# Patient Record
Sex: Female | Born: 1995 | Race: Black or African American | Hispanic: No | Marital: Single | Smoking: Never smoker
Health system: Southern US, Community
[De-identification: ages and names within clinical notes are randomized; demographics above are authoritative.]

## PROBLEM LIST (undated history)

## (undated) DIAGNOSIS — J45909 Unspecified asthma, uncomplicated: Secondary | ICD-10-CM

## (undated) DIAGNOSIS — L0231 Cutaneous abscess of buttock: Principal | ICD-10-CM

## (undated) DIAGNOSIS — L309 Dermatitis, unspecified: Secondary | ICD-10-CM

## (undated) HISTORY — DX: Unspecified asthma, uncomplicated: J45.909

## (undated) HISTORY — PX: NO PAST SURGERIES: SHX2092

## (undated) HISTORY — DX: Dermatitis, unspecified: L30.9

## (undated) HISTORY — DX: Cutaneous abscess of buttock: L02.31

## (undated) HISTORY — PX: WISDOM TOOTH EXTRACTION: SHX21

---
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
## 2013-02-27 ENCOUNTER — Telehealth: Payer: Self-pay | Admitting: Family Medicine

## 2013-02-27 DIAGNOSIS — Z9109 Other allergy status, other than to drugs and biological substances: Secondary | ICD-10-CM

## 2013-02-27 MED ORDER — CETIRIZINE HCL 10 MG PO TABS: 10.0000 mg | ORAL_TABLET | Freq: Every day | ORAL | Status: DC

## 2013-02-27 NOTE — Telephone Encounter (Signed)
Fwd. To PCP for refill. .Jill Reynolds  

## 2013-02-27 NOTE — Telephone Encounter (Signed)
Patient is calling for a refill on her Zyrtec to go to [facility] on [address].  Her pharmacy asked her to call because she doesn't have anymore refills.

## 2013-02-27 NOTE — Telephone Encounter (Signed)
Completed.

## 2013-04-01 ENCOUNTER — Ambulatory Visit (INDEPENDENT_AMBULATORY_CARE_PROVIDER_SITE_OTHER): Payer: Medicaid Other | Admitting: Family Medicine

## 2013-04-01 ENCOUNTER — Encounter: Payer: Self-pay | Admitting: Family Medicine

## 2013-04-01 VITALS — BP 126/79 | HR 76 | Temp 98.4°F | Ht 62.5 in | Wt 170.2 lb

## 2013-04-01 DIAGNOSIS — S31809A Unspecified open wound of unspecified buttock, initial encounter: Secondary | ICD-10-CM

## 2013-04-01 DIAGNOSIS — K6289 Other specified diseases of anus and rectum: Secondary | ICD-10-CM | POA: Insufficient documentation

## 2013-04-01 MED ORDER — BACITRA-NEOMYCIN-POLYMYXIN-HC 1 % OP OINT: TOPICAL_OINTMENT | OPHTHALMIC | Status: DC

## 2013-04-01 MED ORDER — BACIT-POLY-NEO HC 1 % EX OINT: TOPICAL_OINTMENT | Freq: Two times a day (BID) | CUTANEOUS | Status: DC

## 2013-04-01 MED ORDER — DIBUCAINE 1 % RE OINT: 1.0000 "application " | TOPICAL_OINTMENT | RECTAL | Status: DC | PRN

## 2013-04-01 NOTE — Assessment & Plan Note (Signed)
  Intergluteal cleft cracks: Small linear break in skin.     Cortisporin ointment prescribed.     Gluteal hygiene recommended.     Keep Gluteal cleft clean and dry.

## 2013-04-01 NOTE — Patient Instructions (Addendum)
Sitz Bath A sitz bath is a warm water bath taken in the sitting position. The water covers only the hips and butt (buttocks). It may be used for either healing or cleaning purposes. Sitz baths are also used to relieve pain, itching, or muscle tightening (spasms). The water may contain medicine. Moist heat will help you heal and relax.  HOME CARE   Fill the bathtub half-full with warm water.  Sit in the water and open the drain a little.  Turn on the warm water to keep the tub half-full. Keep the water running constantly.  Soak in the water for 15 to 20 minutes.  After the sitz bath, pat the affected area dry.  Take 3 to 4 sitz baths a day. GET HELP RIGHT AWAY IF: You get worse instead of better. Stop the sitz baths if you get worse. MAKE SURE YOU:  Understand these instructions.  Will watch your condition.  Will get help right away if you are not doing well or get worse. Document Released: 12/07/2004 Document Revised: 01/22/2012 Document Reviewed: 02/27/2011 [facility] Patient Information 2013 [place], [place].

## 2013-04-01 NOTE — Assessment & Plan Note (Signed)
Anal pain: Although anoscopy was normal,she might have mild anal fissure.  I recommended sitz bath.     I refilled her  Dibucaine 1% prn pain.     If no improvement or symptom persist might need scoping. F/U in 2-4 wks prn.

## 2013-04-01 NOTE — Progress Notes (Signed)
Subjective:     Patient ID: Jill Reynolds, female   DOB: 1996/04/14, 17 y.o.   MRN: [id number]  HPI Anal pain/Intergluteal cleft itching: This has been going on for the last few month,this resolved with Dibucaine but restarted over the weekend since she stopped using it. There is associated itching as well,pain is annoying about  1-2/10 in severity.No diarrhea,no constipation,no diarrhea,no blood in her stool,no stomach pain. She was told in the past she has hemorrhoids. She also has itching and pain between her gluteal cleft that crack open at times.  Current Outpatient Prescriptions on File Prior to Visit  Medication Sig Dispense Refill  . cetirizine (ZYRTEC) 10 MG tablet Take 1 tablet (10 mg total) by mouth daily.  30 tablet  11  . fluticasone (FLONASE) 50 MCG/ACT nasal spray Place 2 sprays into the nose daily.  16 g  6  . witch hazel-glycerin (TUCKS) pad Apply 1 application topically as needed.  40 each    . albuterol (VENTOLIN HFA) 108 (90 BASE) MCG/ACT inhaler Inhale 2 puffs into the lungs every 4 (four) hours as needed.  1 Inhaler  1  . beclomethasone (QVAR) 80 MCG/ACT inhaler Inhale 1 puff into the lungs 2 (two) times daily.  1 Inhaler  12  . benzocaine-Menthol (DERMOPLAST) 20-0.5 % AERO Apply 1 application topically as needed (perineal discomfort).      . EPINEPHrine (EPIPEN 2-PAK) 0.3 mg/0.3 mL DEVI Inject 0.3 mg into the muscle once.      . lanolin OINT Apply 1 application topically as needed (for breast care).       No current facility-administered medications on file prior to visit.   Past Medical History  Diagnosis Date  . No pertinent past medical history     Review of Systems  Respiratory: Negative.   Cardiovascular: Negative.   Gastrointestinal: Negative.  Negative for nausea, vomiting, abdominal pain, blood in stool and anal bleeding.       Anal itching and pain.  Genitourinary: Negative.   All other systems reviewed and are negative.   Filed Vitals:   04/01/13 0956   BP: 126/79  Pulse: 76  Temp: 98.4 F (36.9 C)  TempSrc: Oral  Height: 5' 2.5" (1.588 m)  Weight: 170 lb 3.2 oz (77.202 kg)        Objective:   Physical Exam  Nursing note and vitals reviewed. Constitutional: She appears well-developed.  Cardiovascular: Normal rate, regular rhythm, normal heart sounds and intact distal pulses.   No murmur heard. Pulmonary/Chest: Effort normal and breath sounds normal. No respiratory distress. She has no wheezes.  Abdominal: Soft. Bowel sounds are normal. She exhibits no distension and no mass. There is no tenderness.  Genitourinary: Rectum normal. Rectal exam shows no internal hemorrhoid and no fissure. Guaiac negative stool.  Could not see any external hemorrhoid partly because of large skin fold around her gluteal area. She does have cracking of the skin of her intergluteal cleft with linear skin break. Anoscope normal findings.       Assessment:     Anal pain: Although anoscopy was normal,she might have mild anal fissure. Intergluteal cleft cracks: Small linear break in skin.    Plan:     1. I recommended sitz bath.     I refilled her  Dibucaine 1% prn pain.     If no improvement or symptom persist might need scoping. F/U in 2-4 wks prn.  2. Cortisporin ointment prescribed.     Gluteal hygiene recommended.  Keep Gluteal cleft clean and dry.     More than 30 min spent for face to face encounter and coordination of care.

## 2013-05-19 ENCOUNTER — Encounter: Payer: Self-pay | Admitting: Family Medicine

## 2013-05-19 ENCOUNTER — Ambulatory Visit (INDEPENDENT_AMBULATORY_CARE_PROVIDER_SITE_OTHER): Payer: Medicaid Other | Admitting: Family Medicine

## 2013-05-19 VITALS — BP 138/75 | HR 98 | Temp 98.1°F | Wt 166.2 lb

## 2013-05-19 DIAGNOSIS — Z7251 High risk heterosexual behavior: Secondary | ICD-10-CM

## 2013-05-19 DIAGNOSIS — L0291 Cutaneous abscess, unspecified: Secondary | ICD-10-CM

## 2013-05-19 DIAGNOSIS — L03317 Cellulitis of buttock: Secondary | ICD-10-CM

## 2013-05-19 DIAGNOSIS — L0231 Cutaneous abscess of buttock: Secondary | ICD-10-CM

## 2013-05-19 MED ORDER — CLINDAMYCIN HCL 300 MG PO CAPS: 300.0000 mg | ORAL_CAPSULE | Freq: Three times a day (TID) | ORAL | Status: DC

## 2013-05-19 NOTE — Progress Notes (Signed)
  Subjective:    Patient ID: Jill Reynolds, female    DOB: [birth year], 17 y.o.   MRN: [id number]  HPI  17 year old F with insect bites. Occurred 2-3 weeks ago while visiting her grandmother. She notied that waking up in the morning after sleeping in a particular bed, she had multiple itching bites. She changed beds and this did not recur. However, since that time, she notes that her bites has become "boils." One on her abdomen that "busted" and two more near her buttocks. Symptoms include pain from these areas which prevents her from sitting down comfortably. She denies fever, chills, nausea or vomiting. She tried to treat them with tea tree oil, but this was ineffective.   Review of Systems See HPI    Objective:   Physical Exam  Constitutional: Vital signs are normal. She appears well-developed and well-nourished. She does not appear ill.  Patient in visible discomfort while trying to sit  Skin: Skin is warm and dry. Rash noted.      BP 138/75  Pulse 98  Temp(Src) 98.1 F (36.7 C) (Oral)  Wt 166 lb 3.2 oz (75.388 kg)  BMI 29.9 kg/m2        Assessment & Plan:

## 2013-05-19 NOTE — Patient Instructions (Addendum)
Miss Tuft,   I think that you have skin abscesses resulting from the bug bites. Do not scratch these areas. Rinse them very well with warm water each day. Also, start taking a medication called Clindamycin three time a day for 10 days. Take the entire amount even if you feel better.   Please follow up next week and we may be able to do the other tests that you wanted.   Sincerely,   Dr. Clinton Sawyer

## 2013-05-21 DIAGNOSIS — L0231 Cutaneous abscess of buttock: Secondary | ICD-10-CM | POA: Insufficient documentation

## 2013-05-21 HISTORY — DX: Cutaneous abscess of buttock: L02.31

## 2013-05-21 NOTE — Assessment & Plan Note (Addendum)
It is unclear if this is truly related to bug bites or whether previous gluteal injuries became infection, but clearly, the patient has draining abscesses on her buttocks. The right buttock was worse than the left. The right side was cultured, then cleaned with sterile water and packed with sterile cause. The left was cleaned with sterile water and a gauze bandage was place over it. I think she has a high risk of have MRSA infections, and bactroban may not work be effective given high friction area, so I will treat with clindamycin. F/u culture and have patient return in 1 week for follow up. Instructed to return sooner if developing worsening pain or fever.

## 2013-05-22 LAB — WOUND CULTURE

## 2013-05-28 ENCOUNTER — Encounter: Payer: Self-pay | Admitting: Family Medicine

## 2013-05-28 ENCOUNTER — Ambulatory Visit (INDEPENDENT_AMBULATORY_CARE_PROVIDER_SITE_OTHER): Payer: Medicaid Other | Admitting: Family Medicine

## 2013-05-28 VITALS — BP 137/78 | HR 78 | Temp 98.3°F | Ht 62.5 in | Wt 164.0 lb

## 2013-05-28 DIAGNOSIS — L0231 Cutaneous abscess of buttock: Secondary | ICD-10-CM

## 2013-05-28 DIAGNOSIS — L03317 Cellulitis of buttock: Secondary | ICD-10-CM

## 2013-05-28 NOTE — Patient Instructions (Signed)
Dear Miss Northington,   I am glad that your wounds are healing. You should continue the rest of the antibiotics and have problems afterwards. There is no risk to other family members at this time.   Please follow up if needed for worsening pain, drainage or development of a fever.   Sincerely,   Dr. Clinton Sawyer

## 2013-05-28 NOTE — Progress Notes (Signed)
  Subjective:    Patient ID: Jill Reynolds, female    DOB: 1996-02-09, 17 y.o.   MRN: [id number]  HPI  17 year old F who presents for follow up of multiple MRSA abscesses on her buttocks. Culture showed sensitivity to Clindamycin, which she was started on last week. Currently, she notes that her pain and drainage if improving. She is taking the Clindamycin as prescribed and has not missed any doses. She denies fever, chills, nausea or vomiting.   Review of Systems See HPI    Objective:   Physical Exam BP 137/78  Pulse 78  Temp(Src) 98.3 F (36.8 C) (Oral)  Ht 5' 2.5" (1.588 m)  Wt 164 lb (74.39 kg)  BMI 29.5 kg/m2  Gen: well appearing, non distressed Skin: left gluteal abscess healing without drainage, no surrounding erythema or fluctuance, base with good granulation tissue; right gluteal abscess well healed     Assessment & Plan:

## 2013-05-28 NOTE — Assessment & Plan Note (Signed)
Healing nicely on Clindamycin. No follow up needed.

## 2013-07-07 ENCOUNTER — Encounter: Payer: Self-pay | Admitting: Family Medicine

## 2013-07-07 ENCOUNTER — Ambulatory Visit (INDEPENDENT_AMBULATORY_CARE_PROVIDER_SITE_OTHER): Payer: Medicaid Other | Admitting: Family Medicine

## 2013-07-07 VITALS — BP 132/86 | HR 82 | Temp 99.0°F | Ht 62.0 in | Wt 162.0 lb

## 2013-07-07 DIAGNOSIS — L0231 Cutaneous abscess of buttock: Secondary | ICD-10-CM

## 2013-07-07 DIAGNOSIS — T782XXA Anaphylactic shock, unspecified, initial encounter: Secondary | ICD-10-CM

## 2013-07-07 MED ORDER — CHLORHEXIDINE GLUCONATE 4 % EX LIQD: 60.0000 mL | Freq: Every day | CUTANEOUS | Status: DC | PRN

## 2013-07-07 MED ORDER — EPINEPHRINE 0.3 MG/0.3ML IJ SOAJ: 0.3000 mg | Freq: Once | INTRAMUSCULAR | Status: DC

## 2013-07-07 NOTE — Patient Instructions (Addendum)
Community-Associated MRSA CA-MRSA stands for community-associated methicillin-resistant Staphylococcus aureus. MRSA is a type of bacteria that is resistant to some common antibiotics. It can cause infections in the skin and many other places in the body. Staphylococcus aureus, often called "staph," is a bacteria that normally lives on the skin or in the nose. Staph on the surface of the skin or in the nose does not cause problems. However, if the staph enters the body through a cut, wound, or break in the skin, an infection can happen. Up until recently, infections with the MRSA type of staph mainly occurred in hospitals and other healthcare settings. There are now increasing problems with MRSA infections in the community as well. Infections with MRSA may be very serious or even life-threatening. CA-MRSA is becoming more common. It is known to spread in crowded settings, in jails and prisons, and in situations where there is close skin-to-skin contact, such as during sporting events or in locker rooms. MRSA can be spread through shared items, such as children's toys, razors, towels, or sports equipment.  CAUSES All staph, including MRSA, are normally harmless unless they enter the body through a scratch, cut, or wound, such as with surgery. All staph, including MRSA, can be spread from person-to-person by touching contaminated objects or through direct contact.  MRSA now causes illness in people who have not been in hospitals or other healthcare facilities. Cases of MRSA diseases in the community have been associated with:  Recent antibiotic use.  Sharing contaminated towels or clothes.  Having active skin diseases.  Participating in contact sports.  Living in crowded settings.  Intravenous (IV) drug use.  Community-associated MRSA infections are usually skin infections, but may cause other severe illnesses.  Staph bacteria are one of the most common causes of skin infection. However, they are  also a common cause of pneumonia, bone or joint infections, and bloodstream infections. DIAGNOSIS Diagnosis of MRSA is done by cultures of fluid samples that may come from:  Swabs taken from cuts or wounds in infected areas.  Nasal swabs.  Saliva or deep cough specimens from the lungs (sputum).  Urine.  Blood. Many people are "colonized" with MRSA but have no signs of infection. This means that people carry the MRSA germ on their skin or in their nose and may never develop MRSA infection.  TREATMENT  Treatment varies and is based on how serious, how deep, or how extensive the infection is. For example:  Some skin infections, such as a small boil or abscess, may be treated by draining yellowish-white fluid (pus) from the site of the infection.  Deeper or more widespread soft tissue infections are usually treated with surgery to drain pus and with antibiotic medicine given by vein or by mouth. This may be recommended even if you are pregnant.  Serious infections may require a hospital stay. If antibiotics are given, they may be needed for several weeks. PREVENTION Because many people are colonized with staph, including MRSA, preventing the spread of the bacteria from person-to-person is most important. The best way to prevent the spread of bacteria and other germs is through proper hand washing or by using alcohol-based hand disinfectants. The following are other ways to help prevent MRSA infection within community settings.   Wash your hands frequently with soap and water for at least 15 seconds. Otherwise, use alcohol-based hand disinfectants when soap and water is not available.  Make sure people who live with you wash their hands often, too.  Do not share  personal items. For example, avoid sharing razors and other personal hygiene items, towels, clothing, and athletic equipment.  Wash and dry your clothes and bedding at the warmest temperatures recommended on the labels.  Keep  wounds covered. Pus from infected sores may contain MRSA and other bacteria. Keep cuts and abrasions clean and covered with germ-free (sterile), dry bandages until they are healed.  If you have a wound that appears infected, ask your caregiver if a culture for MRSA and other bacteria should be done.  If you are breastfeeding, talk to your caregiver about MRSA. You may be asked to temporarily stop breastfeeding. HOME CARE INSTRUCTIONS   Take your antibiotics as directed. Finish them even if you start to feel better.  Avoid close contact with those around you as much as possible. Do not use towels, razors, toothbrushes, bedding, or other items that will be used by others.  To fight the infection, follow your caregiver's instructions for wound care. Wash your hands before and after changing your bandages.  If you have an intravascular device, such as a catheter, make sure you know how to care for it.  Be sure to tell any healthcare providers that you have MRSA so they are aware of your infection. SEEK IMMEDIATE MEDICAL CARE IF:  The infection appears to be getting worse. Signs include:  Increased warmth, redness, or tenderness around the wound site.  A red line that extends from the infection site.  A dark color in the area around the infection.  Wound drainage that is tan, yellow, or green.  A bad smell coming from the wound.  You feel sick to your stomach (nauseous) and throw up (vomit) or cannot keep medicine down.  You have a fever.  Your baby is older than 3 months with a rectal temperature of 102 F (38.9 C) or higher.  Your baby is 14 months old or younger with a rectal temperature of 100.4 F (38 C) or higher.  You have difficulty breathing. MAKE SURE YOU:   Understand these instructions.  Will watch your condition.  Will get help right away if you are not doing well or get worse. Document Released: 02/02/2006 Document Revised: 01/22/2012 Document Reviewed:  02/02/2011 [facility] Patient Information 2014 [place], [place].   Use Hibiclens once weekly as prescribed. May need to use chronically. Return as needed if you have any other wounds/abscesses develop.

## 2013-07-07 NOTE — Assessment & Plan Note (Signed)
No active cellulitis or abscess noted on examination today, postinflammatory changes present, patient still has mild outbreaks of pimples/cellulitis -Patient will be treated with Hibiclens solution to be applied to the affected area once weekly, she will attempt 1-2 month trial of this therapy

## 2013-07-07 NOTE — Assessment & Plan Note (Signed)
No recent anaphylactic reactions, patient has not needed to use EpiPen however her previous prescription is expired -EpiPen prescription provided today

## 2013-07-07 NOTE — Progress Notes (Signed)
  Subjective:    Patient ID: Jill Reynolds, female    DOB: October 22, 1996, 17 y.o.   MRN: [id number]  HPI 17 year old female presents for followup of MRSA cellulitis/abscess of her bottom. Her symptoms have resolved with treatment. She completed course of clindamycin prescribed in July. She does continue to have occasional breakouts in the area. These areas come to a head and she will "pop them ". This will relieve the symptoms and pressure in the area. She denies active lesions, no fevers or chills, no difficulty with stooling  Patient has a history of anaphylactic reaction and needs a refill of EpiPen today   Review of Systems  Constitutional: Negative for fever and chills.  Gastrointestinal: Negative for nausea, vomiting, diarrhea and constipation.  Skin: Positive for wound.       Objective:   Physical Exam Vitals: Reviewed General: Pleasant African American female in no acute distress Cardiac: Regular rate and rhythm, S1 and S2 present, no murmurs Respiratory: Clear to patient bilaterally, normal effort Skin: Left gluteal fold-multiple hyperpigmented areas consistent with postinflammatory changes from previous abscess/cellulitis, no active drainage, no warmth, no erythema, no active cellulitis; hyperpigmented lesion over the right upper abdomen from previous abscess that is well-healed       Assessment & Plan:  Please see problem specific assessment and plan

## 2013-07-23 ENCOUNTER — Ambulatory Visit: Payer: Medicaid Other | Admitting: Family Medicine

## 2014-03-16 ENCOUNTER — Other Ambulatory Visit: Payer: Self-pay | Admitting: *Deleted

## 2014-03-16 DIAGNOSIS — Z9109 Other allergy status, other than to drugs and biological substances: Secondary | ICD-10-CM

## 2014-03-17 MED ORDER — CETIRIZINE HCL 10 MG PO TABS: 10.0000 mg | ORAL_TABLET | Freq: Every day | ORAL | Status: DC

## 2014-09-04 ENCOUNTER — Encounter: Payer: Self-pay | Admitting: Family Medicine

## 2014-09-04 ENCOUNTER — Ambulatory Visit (INDEPENDENT_AMBULATORY_CARE_PROVIDER_SITE_OTHER): Payer: Medicaid Other | Admitting: Family Medicine

## 2014-09-04 VITALS — BP 121/75 | HR 98 | Temp 98.0°F | Ht 62.0 in | Wt 179.0 lb

## 2014-09-04 DIAGNOSIS — J301 Allergic rhinitis due to pollen: Secondary | ICD-10-CM

## 2014-09-04 DIAGNOSIS — J4520 Mild intermittent asthma, uncomplicated: Secondary | ICD-10-CM

## 2014-09-04 DIAGNOSIS — J4540 Moderate persistent asthma, uncomplicated: Secondary | ICD-10-CM

## 2014-09-04 DIAGNOSIS — L2089 Other atopic dermatitis: Secondary | ICD-10-CM | POA: Insufficient documentation

## 2014-09-04 DIAGNOSIS — L209 Atopic dermatitis, unspecified: Secondary | ICD-10-CM

## 2014-09-04 MED ORDER — ALBUTEROL SULFATE HFA 108 (90 BASE) MCG/ACT IN AERS: 2.0000 | INHALATION_SPRAY | RESPIRATORY_TRACT | Status: DC | PRN

## 2014-09-04 MED ORDER — HYDROCORTISONE 2.5 % EX CREA: TOPICAL_CREAM | Freq: Two times a day (BID) | CUTANEOUS | Status: DC

## 2014-09-04 NOTE — Patient Instructions (Signed)
It was a pleasure to see you today.   For the eczema, I recommend using mineral oil/baby oil without fragrances, after bathing.   I sent in hydrocortisone 2.5% cream, apply two times daily to affected skin on the neck, for no more than 14 consecutive days.  Hold it if you notice you are doing much better before the 14 days are up.   If you are not better in the 14 days of using the cream, please return for re-evaluation.   I will send a referral for the Allergist to consider desensitization treatment for your longstanding allergies and asthma.   I sent in the prescription for an albuterol inhaler, in case you develop a flare.

## 2014-09-04 NOTE — Assessment & Plan Note (Addendum)
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 Also with mild intermittent asthma and atopic dermatitis. She requests evaluation with Allergy/Immunology.

## 2014-09-04 NOTE — Assessment & Plan Note (Signed)
Along sides of neck. To try moderate potency HC 2.5% cream for 2 weeks, to contact back if worse or not responding.

## 2014-09-04 NOTE — Assessment & Plan Note (Signed)
Well controlled. Albuterol HFA prescription sent in so that patient has available in the event of a flare.

## 2014-09-04 NOTE — Progress Notes (Signed)
   Subjective:    Patient ID: Jill Reynolds, female    DOB: 03/09/1996, 18 y.o.   MRN: [id number]  HPI  Patient presents for complaint of itchy rash along the sides of her neck.  Has suffered from eczema in the past, similar to this rash.  Has waxed and waned, is mildly better today than on recent days.  Worsened about 1 month ago with change in weather. Has not tried anything on it recently except for A[employer]D ointment and [employer] (both mildly helpful); in the past used some triamcinolone from a family member and this helped.   Also with diagnosis of asthma, has not used albuterol rescue inhaler in a long time (is not sure she even has an active albuterol HFA at home).  Unsure of exact triggers.  Suffers from rhinorrhea and has frequent coryza symptoms, takes cetirizine daily all year round and would like to try another therapy that does not involve daily pills. Asks about allergist evaluation.   Review of Systems Denies fevers or chills; no dyspnea or cough, no chest pain.     Objective:   Physical Exam Well appearing, no apparent distress.  HEENT Injected conjunctivae; PERL. TMs clear. Injected oropharynx.  COR Regular S1S2, no extra sounds PULM Clear bilat no wheezes or rales.  SKIN: raised erythematous excoriated patches along lateral aspects of neck. None in nape of neck, postauricular areas, on flexor surfaces of elbows.        Assessment & Plan:

## 2014-09-14 ENCOUNTER — Encounter: Payer: Self-pay | Admitting: Family Medicine

## 2014-11-10 ENCOUNTER — Other Ambulatory Visit (HOSPITAL_COMMUNITY)
Admission: RE | Admit: 2014-11-10 | Discharge: 2014-11-10 | Disposition: A | Discharge status: Home / Self Care | Payer: Medicaid Other | Source: Ambulatory Visit | Attending: Family Medicine | Admitting: Family Medicine

## 2014-11-10 ENCOUNTER — Encounter (HOSPITAL_COMMUNITY): Payer: Self-pay | Admitting: Emergency Medicine

## 2014-11-10 ENCOUNTER — Emergency Department (INDEPENDENT_AMBULATORY_CARE_PROVIDER_SITE_OTHER)
Admission: EM | Admit: 2014-11-10 | Discharge: 2014-11-10 | Disposition: A | Discharge status: Home / Self Care | Payer: Medicaid Other | Source: Home / Self Care | Attending: Family Medicine | Admitting: Family Medicine

## 2014-11-10 DIAGNOSIS — N760 Acute vaginitis: Secondary | ICD-10-CM | POA: Diagnosis present

## 2014-11-10 DIAGNOSIS — N898 Other specified noninflammatory disorders of vagina: Secondary | ICD-10-CM

## 2014-11-10 DIAGNOSIS — J4531 Mild persistent asthma with (acute) exacerbation: Secondary | ICD-10-CM

## 2014-11-10 DIAGNOSIS — Z113 Encounter for screening for infections with a predominantly sexual mode of transmission: Secondary | ICD-10-CM | POA: Insufficient documentation

## 2014-11-10 LAB — CERVICOVAGINAL ANCILLARY ONLY
WET PREP (BD AFFIRM): NEGATIVE
Wet Prep (BD Affirm): NEGATIVE
Wet Prep (BD Affirm): NEGATIVE

## 2014-11-10 LAB — POCT RAPID STREP A: STREPTOCOCCUS, GROUP A SCREEN (DIRECT): NEGATIVE

## 2014-11-10 MED ORDER — IPRATROPIUM-ALBUTEROL 0.5-2.5 (3) MG/3ML IN SOLN
RESPIRATORY_TRACT | Status: AC
  Filled 2014-11-10: qty 3

## 2014-11-10 MED ORDER — ALBUTEROL SULFATE (2.5 MG/3ML) 0.083% IN NEBU
INHALATION_SOLUTION | RESPIRATORY_TRACT | Status: AC
  Filled 2014-11-10: qty 3

## 2014-11-10 MED ORDER — PREDNISONE 20 MG PO TABS: ORAL_TABLET | ORAL | Status: DC

## 2014-11-10 MED ORDER — METRONIDAZOLE 500 MG PO TABS: 500.0000 mg | ORAL_TABLET | Freq: Two times a day (BID) | ORAL | Status: DC

## 2014-11-10 MED ORDER — ALBUTEROL SULFATE (2.5 MG/3ML) 0.083% IN NEBU
2.5000 mg | INHALATION_SOLUTION | Freq: Once | RESPIRATORY_TRACT | Status: AC
  Administered 2014-11-10: 2.5 mg via RESPIRATORY_TRACT

## 2014-11-10 MED ORDER — IPRATROPIUM-ALBUTEROL 0.5-2.5 (3) MG/3ML IN SOLN
3.0000 mL | Freq: Once | RESPIRATORY_TRACT | Status: AC
  Administered 2014-11-10: 3 mL via RESPIRATORY_TRACT

## 2014-11-10 NOTE — Discharge Instructions (Signed)
Asthma, Acute Bronchospasm °Acute bronchospasm caused by asthma is also referred to as an asthma attack. Bronchospasm means your air passages become narrowed. The narrowing is caused by inflammation and tightening of the muscles in the air tubes (bronchi) in your lungs. This can make it hard to breathe or cause you to wheeze and cough. °CAUSES °Possible triggers are: °· Animal dander from the skin, hair, or feathers of animals. °· Dust mites contained in house dust. °· Cockroaches. °· Pollen from trees or grass. °· Mold. °· Cigarette or tobacco smoke. °· Air pollutants such as dust, household cleaners, hair sprays, aerosol sprays, paint fumes, strong chemicals, or strong odors. °· Cold air or weather changes. Cold air may trigger inflammation. Winds increase molds and pollens in the air. °· Strong emotions such as crying or laughing hard. °· Stress. °· Certain medicines such as aspirin or beta-blockers. °· Sulfites in foods and drinks, such as dried fruits and wine. °· Infections or inflammatory conditions, such as a flu, cold, or inflammation of the nasal membranes (rhinitis). °· Gastroesophageal reflux disease (GERD). GERD is a condition where stomach acid backs up into your esophagus. °· Exercise or strenuous activity. °SIGNS AND SYMPTOMS  °· Wheezing. °· Excessive coughing, particularly at night. °· Chest tightness. °· Shortness of breath. °DIAGNOSIS  °Your health care provider will ask you about your medical history and perform a physical exam. A chest X-ray or blood testing may be performed to look for other causes of your symptoms or other conditions that may have triggered your asthma attack.  °TREATMENT  °Treatment is aimed at reducing inflammation and opening up the airways in your lungs.  Most asthma attacks are treated with inhaled medicines. These include quick relief or rescue medicines (such as bronchodilators) and controller medicines (such as inhaled corticosteroids). These medicines are sometimes  given through an inhaler or a nebulizer. Systemic steroid medicine taken by mouth or given through an IV tube also can be used to reduce the inflammation when an attack is moderate or severe. Antibiotic medicines are only used if a bacterial infection is present.  °HOME CARE INSTRUCTIONS  °· Rest. °· Drink plenty of liquids. This helps the mucus to remain thin and be easily coughed up. Only use caffeine in moderation and do not use alcohol until you have recovered from your illness. °· Do not smoke. Avoid being exposed to secondhand smoke. °· You play a critical role in keeping yourself in good health. Avoid exposure to things that cause you to wheeze or to have breathing problems. °· Keep your medicines up-to-date and available. Carefully follow your health care provider's treatment plan. °· Take your medicine exactly as prescribed. °· When pollen or pollution is bad, keep windows closed and use an air conditioner or go to places with air conditioning. °· Asthma requires careful medical care. See your health care provider for a follow-up as advised. If you are more than [redacted] weeks pregnant and you were prescribed any new medicines, let your obstetrician know about the visit and how you are doing. Follow up with your health care provider as directed. °· After you have recovered from your asthma attack, make an appointment with your outpatient doctor to talk about ways to reduce the likelihood of future attacks. If you do not have a doctor who manages your asthma, make an appointment with a primary care doctor to discuss your asthma. °SEEK IMMEDIATE MEDICAL CARE IF:  °· You are getting worse. °· You have trouble breathing. If severe, call your local   emergency services (911 in the [place] °· You develop chest pain or discomfort. °· You are vomiting. °· You are not able to keep fluids down. °· You are coughing up yellow, green, brown, or bloody sputum. °· You have a fever and your symptoms suddenly get worse. °· You have  trouble swallowing. °MAKE SURE YOU:  °· Understand these instructions. °· Will watch your condition. °· Will get help right away if you are not doing well or get worse. °Document Released: 02/14/2007 Document Revised: 11/04/2013 Document Reviewed: 05/07/2013 °Exit[employer]® Patient Information ©2015 [employer]. This information is not intended to replace advice given to you by your health care provider. Make sure you discuss any questions you have with your health care provider. ° °Asthma °Asthma is a recurring condition in which the airways tighten and narrow. Asthma can make it difficult to breathe. It can cause coughing, wheezing, and shortness of breath. Asthma episodes, also called asthma attacks, range from minor to life-threatening. Asthma cannot be cured, but medicines and lifestyle changes can help control it. °CAUSES °Asthma is believed to be caused by inherited (genetic) and environmental factors, but its exact cause is unknown. Asthma may be triggered by allergens, lung infections, or irritants in the air. Asthma triggers are different for each person. Common triggers include:  °· Animal dander. °· Dust mites. °· Cockroaches. °· Pollen from trees or grass. °· Mold. °· Smoke. °· Air pollutants such as dust, household cleaners, hair sprays, aerosol sprays, paint fumes, strong chemicals, or strong odors. °· Cold air, weather changes, and winds (which increase molds and pollens in the air). °· Strong emotional expressions such as crying or laughing hard. °· Stress. °· Certain medicines (such as aspirin) or types of drugs (such as beta-blockers). °· Sulfites in foods and drinks. Foods and drinks that may contain sulfites include dried fruit, potato chips, and sparkling grape juice. °· Infections or inflammatory conditions such as the flu, a cold, or an inflammation of the nasal membranes (rhinitis). °· Gastroesophageal reflux disease (GERD). °· Exercise or strenuous activity. °SYMPTOMS °Symptoms may occur  immediately after asthma is triggered or many hours later. Symptoms include: °· Wheezing. °· Excessive nighttime or early morning coughing. °· Frequent or severe coughing with a common cold. °· Chest tightness. °· Shortness of breath. °DIAGNOSIS  °The diagnosis of asthma is made by a review of your medical history and a physical exam. Tests may also be performed. These may include: °· Lung function studies. These tests show how much air you breathe in and out. °· Allergy tests. °· Imaging tests such as X-rays. °TREATMENT  °Asthma cannot be cured, but it can usually be controlled. Treatment involves identifying and avoiding your asthma triggers. It also involves medicines. There are 2 classes of medicine used for asthma treatment:  °· Controller medicines. These prevent asthma symptoms from occurring. They are usually taken every day. °· Reliever or rescue medicines. These quickly relieve asthma symptoms. They are used as needed and provide short-term relief. °Your health care provider will help you create an asthma action plan. An asthma action plan is a written plan for managing and treating your asthma attacks. It includes a list of your asthma triggers and how they may be avoided. It also includes information on when medicines should be taken and when their dosage should be changed. An action plan may also involve the use of a device called a peak flow meter. A peak flow meter measures how well the lungs are working. It helps you   monitor your condition. °HOME CARE INSTRUCTIONS  °· Take medicines only as directed by your health care provider. Speak with your health care provider if you have questions about how or when to take the medicines. °· Use a peak flow meter as directed by your health care provider. Record and keep track of readings. °· Understand and use the action plan to help minimize or stop an asthma attack without needing to seek medical care. °· Control your home environment in the following ways to  help prevent asthma attacks: °¨ Do not smoke. Avoid being exposed to secondhand smoke. °¨ Change your heating and air conditioning filter regularly. °¨ Limit your use of fireplaces and wood stoves. °¨ Get rid of pests (such as roaches and mice) and their droppings. °¨ Throw away plants if you see mold on them. °¨ Clean your floors and dust regularly. Use unscented cleaning products. °¨ Try to have someone else vacuum for you regularly. Stay out of rooms while they are being vacuumed and for a short while afterward. If you vacuum, use a dust mask from a hardware store, a double-layered or microfilter vacuum cleaner bag, or a vacuum cleaner with a HEPA filter. °¨ Replace carpet with wood, tile, or vinyl flooring. Carpet can trap dander and dust. °¨ Use allergy-proof pillows, mattress covers, and box spring covers. °¨ Wash bed sheets and blankets every week in hot water and dry them in a dryer. °¨ Use blankets that are made of polyester or cotton. °¨ Clean bathrooms and kitchens with bleach. If possible, have someone repaint the walls in these rooms with mold-resistant paint. Keep out of the rooms that are being cleaned and painted. °¨ Wash hands frequently. °SEEK MEDICAL CARE IF:  °· You have wheezing, shortness of breath, or a cough even if taking medicine to prevent attacks. °· The colored mucus you cough up (sputum) is thicker than usual. °· Your sputum changes from clear or white to yellow, green, gray, or bloody. °· You have any problems that may be related to the medicines you are taking (such as a rash, itching, swelling, or trouble breathing). °· You are using a reliever medicine more than 2-3 times per week. °· Your peak flow is still at 50-79% of your personal best after following your action plan for 1 hour. °· You have a fever. °SEEK IMMEDIATE MEDICAL CARE IF:  °· You seem to be getting worse and are unresponsive to treatment during an asthma attack. °· You are short of breath even at rest. °· You get  short of breath when doing very little physical activity. °· You have difficulty eating, drinking, or talking due to asthma symptoms. °· You develop chest pain. °· You develop a fast heartbeat. °· You have a bluish color to your lips or fingernails. °· You are light-headed, dizzy, or faint. °· Your peak flow is less than 50% of your personal best. °MAKE SURE YOU:  °· Understand these instructions. °· Will watch your condition. °· Will get help right away if you are not doing well or get worse. °Document Released: 10/30/2005 Document Revised: 03/16/2014 Document Reviewed: 05/29/2013 °ExitCare® Patient Information ©2015 [employer]. This information is not intended to replace advice given to you by your health care provider. Make sure you discuss any questions you have with your health care provider. ° °

## 2014-11-10 NOTE — ED Notes (Signed)
Patient c/o asthma x 1 week. She reports she has used her inhaler with mild relief. Patient reports she also has had a sore throat also and everyone in her house has been sick. Patient is in NAD.

## 2014-11-10 NOTE — ED Provider Notes (Signed)
CSN: [id number]     Arrival date & time 11/10/14  1345 History   First MD Initiated Contact with Patient 11/10/14 1418     Chief Complaint  Patient presents with  . Asthma  . Sore Throat   (Consider location/radiation/quality/duration/timing/severity/associated sxs/prior Treatment) HPI Comments: 18 year old female with a history of asthma states she has had increased shortness of breath for the past week. She just found her albuterol HFA yesterday and used it every 4 hours without complete relief. She presents with shortness of breath secondary to her asthma. Denies fever or chills. She is no longer using her Qvar.  At the time of discharge pt is now complaining of a vaginal discharge.   Past Medical History  Diagnosis Date  . No pertinent past medical history   . MRSA abscess of multiple sites of buttock 05/21/2013   Past Surgical History  Procedure Laterality Date  . No past surgeries     No family history on file. History  Substance Use Topics  . Smoking status: Never Smoker   . Smokeless tobacco: Not on file  . Alcohol Use: No   OB History    Gravida Para Term Preterm AB TAB SAB Ectopic Multiple Living   1 1 1       1      Review of Systems  Constitutional: Positive for activity change. Negative for fever.  HENT: Negative.   Respiratory: Positive for shortness of breath and wheezing.   Gastrointestinal: Negative.   Genitourinary: Negative.     Allergies  Vicodin and Peanut-containing drug products  Home Medications   Prior to Admission medications   Medication Sig Start Date End Date Taking? Authorizing Provider  albuterol (VENTOLIN HFA) 108 (90 BASE) MCG/ACT inhaler Inhale 2 puffs into the lungs every 4 (four) hours as needed. 09/04/14   Barbaraann BarthelJames O Breen, MD  beclomethasone (QVAR) 80 MCG/ACT inhaler Inhale 1 puff into the lungs 2 (two) times daily. 06/20/12 06/20/13  Barbaraann BarthelJames O Breen, MD  cetirizine (ZYRTEC) 10 MG tablet Take 1 tablet (10 mg total) by mouth daily.  03/16/15   Latrelle DodrillBrittany J McIntyre, MD  chlorhexidine (HIBICLENS) 4 % external liquid Apply 60 mLs (4 application total) topically daily as needed. Cleans affected area once weekly for at least 2-3 minutes, rinse off skin with water 07/07/13   Uvaldo RisingKyle J Fletke, MD  EPINEPHrine (EPIPEN) 0.3 mg/0.3 mL SOAJ injection Inject 0.3 mLs (0.3 mg total) into the muscle once. 07/07/13   Uvaldo RisingKyle J Fletke, MD  fluticasone (FLONASE) 50 MCG/ACT nasal spray Place 2 sprays into the nose daily. 10/07/12   Everrett Coombeody Matthews, DO  hydrocortisone 2.5 % cream Apply topically 2 (two) times daily. 09/04/14   Barbaraann BarthelJames O Breen, MD  lanolin OINT Apply 1 application topically as needed (for breast care). 08/26/12   Renee A Kuneff, DO  metroNIDAZOLE (FLAGYL) 500 MG tablet Take 1 tablet (500 mg total) by mouth 2 (two) times daily. X 7 days 11/10/14   Hayden Rasmussenavid Yeison Sippel, NP  predniSONE (DELTASONE) 20 MG tablet Take 3 tabs po on first day, 2 tabs second day, 2 tabs third day, 1 tab fourth day, 1 tab 5th day. Take with food. 11/10/14   Hayden Rasmussenavid Diedre Maclellan, NP  witch hazel-glycerin (TUCKS) pad Apply 1 application topically as needed. 08/26/12   Renee A Kuneff, DO   BP 138/83 mmHg  Pulse 78  Temp(Src) 98.5 F (36.9 C) (Oral)  Resp 16  SpO2 100%  LMP 11/03/2014 Physical Exam  Constitutional: She is oriented to person,  place, and time. She appears well-developed and well-nourished. No distress.  HENT:  Mouth/Throat: No oropharyngeal exudate.  OP with minor erythema and scant clear PND.   Eyes: Conjunctivae and EOM are normal.  Neck: Normal range of motion. Neck supple.  Cardiovascular: Normal rate, regular rhythm and normal heart sounds.   Pulmonary/Chest: Effort normal. She has wheezes.  Prolonged expiratory phase  Genitourinary: Vaginal discharge found.  NEFG Small amt light green vag discharge Ectocx mildly red and inflamed.   Lymphadenopathy:    She has no cervical adenopathy.  Neurological: She is alert and oriented to person, place, and time.  Skin: Skin is  warm and dry.  Nursing note and vitals reviewed.   ED Course  Procedures (including critical care time) Labs Review Labs Reviewed  CULTURE, GROUP A STREP  POCT RAPID STREP A ([facility] ONLY)  CERVICOVAGINAL ANCILLARY ONLY   Results for orders placed or performed during the hospital encounter of 11/10/14  POCT rapid strep A [facility])  Result Value Ref Range   Streptococcus, Group A Screen (Direct) NEGATIVE NEGATIVE    Imaging Review No results found.   MDM   1. Asthma exacerbation attacks, mild persistent   2. Vaginal discharge      Post duoneb pt's lungs are clear. St breathing normally.  Restart your Q var. Use albuterol prn Prednisone taper cerv cytology pending. Flagyl    Hayden Rasmussenavid Yazhini Mcaulay, NP 11/10/14 1555

## 2014-11-11 LAB — CERVICOVAGINAL ANCILLARY ONLY
Chlamydia: NEGATIVE
Neisseria Gonorrhea: NEGATIVE

## 2014-11-12 LAB — CULTURE, GROUP A STREP

## 2014-11-19 ENCOUNTER — Ambulatory Visit: Payer: Medicaid Other | Admitting: Family Medicine

## 2015-01-08 ENCOUNTER — Other Ambulatory Visit: Payer: Self-pay | Admitting: Family Medicine

## 2015-01-08 ENCOUNTER — Telehealth: Payer: Self-pay | Admitting: Family Medicine

## 2015-01-08 DIAGNOSIS — J4520 Mild intermittent asthma, uncomplicated: Secondary | ICD-10-CM

## 2015-01-08 MED ORDER — ALBUTEROL SULFATE HFA 108 (90 BASE) MCG/ACT IN AERS: 2.0000 | INHALATION_SPRAY | RESPIRATORY_TRACT | Status: DC | PRN

## 2015-01-08 NOTE — Telephone Encounter (Signed)
Requesting refill on Qvar and albuterol, pt goes to [facility]

## 2015-01-08 NOTE — Telephone Encounter (Signed)
I received a refill request for both Qvar and albuterol. I have sent in a refill of the albuterol, but I'm not sure how patient could still be taking Qvar as it has not been prescribed by us since 2013. She saw Dr. Mauricio PoBreen in October and there is no mention of Qvar in his note. If patient feels that she needs Qvar, she should schedule an appointment to follow-up on her asthma so that I can assess her prior to prescribing medication.  Latrelle DodrillBrittany J McIntyre, MD

## 2015-01-11 ENCOUNTER — Encounter (HOSPITAL_COMMUNITY): Payer: Self-pay | Admitting: Emergency Medicine

## 2015-01-11 ENCOUNTER — Emergency Department (INDEPENDENT_AMBULATORY_CARE_PROVIDER_SITE_OTHER)
Admission: EM | Admit: 2015-01-11 | Discharge: 2015-01-11 | Disposition: A | Discharge status: Home / Self Care | Payer: Medicaid Other | Source: Home / Self Care | Attending: Family Medicine | Admitting: Family Medicine

## 2015-01-11 DIAGNOSIS — J4531 Mild persistent asthma with (acute) exacerbation: Secondary | ICD-10-CM

## 2015-01-11 HISTORY — DX: Unspecified asthma, uncomplicated: J45.909

## 2015-01-11 MED ORDER — BECLOMETHASONE DIPROPIONATE 40 MCG/ACT IN AERS: 2.0000 | INHALATION_SPRAY | Freq: Two times a day (BID) | RESPIRATORY_TRACT | Status: AC

## 2015-01-11 NOTE — ED Notes (Signed)
Pt has been having issues with her asthma at night.  She states she starts wheezing and gets short of breath.  She is out of one inhaler and has not been able to get the Rx renewed until she makes an appointment with her PCP.

## 2015-01-11 NOTE — ED Provider Notes (Signed)
CSN: [id number]     Arrival date & time 01/11/15  1305 History   First MD Initiated Contact with Patient 01/11/15 1444     Chief Complaint  Patient presents with  . Asthma   (Consider location/radiation/quality/duration/timing/severity/associated sxs/prior Treatment) HPI Comments: 19 year old female who has a history of asthma presents today with a complaint that she has been having more frequent episodes of wheezing and shortness of breath due to asthma. She has been out of her Qvar for approximately one week which is same length of time she has had symptoms. She called her physician and they would not refill her medicine because she missed her last appointment in favor of coming to the urgent care and she has not followed up as required. Therefore she is here to have her Qvar refilled.   Past Medical History  Diagnosis Date  . Asthma    History reviewed. No pertinent past surgical history. History reviewed. No pertinent family history. History  Substance Use Topics  . Smoking status: Never Smoker   . Smokeless tobacco: Never Used  . Alcohol Use: No   OB History    No data available     Review of Systems  Constitutional: Positive for activity change.  HENT: Positive for sore throat.   Respiratory: Positive for shortness of breath and wheezing.   Cardiovascular: Negative.   Genitourinary: Negative.   All other systems reviewed and are negative.   Allergies  Vicodin  Home Medications   Prior to Admission medications   Medication Sig Start Date End Date Taking? Authorizing Provider  Albuterol Sulfate (PROAIR HFA IN) Inhale into the lungs.   Yes Historical Provider, MD  beclomethasone (QVAR) 40 MCG/ACT inhaler Inhale 2 puffs into the lungs 2 (two) times daily. 01/11/15   Hayden Rasmussenavid Dorean Daniello, NP   BP 125/66 mmHg  Pulse 87  Temp(Src) 98.8 F (37.1 C) (Oral)  Resp 19  SpO2 99%  LMP 12/28/2014 (Approximate) Physical Exam  Constitutional: She is oriented to person, place, and  time. She appears well-developed and well-nourished. No distress.  Neck: Normal range of motion. Neck supple.  Cardiovascular: Normal rate, regular rhythm, normal heart sounds and intact distal pulses.   Pulmonary/Chest: Effort normal and breath sounds normal. No respiratory distress. She has no wheezes. She has no rales.  Neurological: She is alert and oriented to person, place, and time. She exhibits normal muscle tone.  Skin: Skin is warm.  Psychiatric: She has a normal mood and affect.  Nursing note and vitals reviewed.   ED Course  Procedures (including critical care time) Labs Review Labs Reviewed - No data to display  Imaging Review No results found.   MDM   1. Asthma exacerbation attacks, mild persistent    Refill Qvar. Continue to use albuterol as needed. During the exam her lungs are perfectly clear. Inspiration equals expiration. She is in no distress. She is instructed to follow-up with her PCP.    [name], NP 01/11/15 [phone]

## 2015-01-11 NOTE — Discharge Instructions (Signed)
Asthma Asthma is a recurring condition in which the airways tighten and narrow. Asthma can make it difficult to breathe. It can cause coughing, wheezing, and shortness of breath. Asthma episodes, also called asthma attacks, range from minor to life-threatening. Asthma cannot be cured, but medicines and lifestyle changes can help control it. CAUSES Asthma is believed to be caused by inherited (genetic) and environmental factors, but its exact cause is unknown. Asthma may be triggered by allergens, lung infections, or irritants in the air. Asthma triggers are different for each person. Common triggers include:   Animal dander.  Dust mites.  Cockroaches.  Pollen from trees or grass.  Mold.  Smoke.  Air pollutants such as dust, household cleaners, hair sprays, aerosol sprays, paint fumes, strong chemicals, or strong odors.  Cold air, weather changes, and winds (which increase molds and pollens in the air).  Strong emotional expressions such as crying or laughing hard.  Stress.  Certain medicines (such as aspirin) or types of drugs (such as beta-blockers).  Sulfites in foods and drinks. Foods and drinks that may contain sulfites include dried fruit, potato chips, and sparkling grape juice.  Infections or inflammatory conditions such as the flu, a cold, or an inflammation of the nasal membranes (rhinitis).  Gastroesophageal reflux disease (GERD).  Exercise or strenuous activity. SYMPTOMS Symptoms may occur immediately after asthma is triggered or many hours later. Symptoms include:  Wheezing.  Excessive nighttime or early morning coughing.  Frequent or severe coughing with a common cold.  Chest tightness.  Shortness of breath. DIAGNOSIS  The diagnosis of asthma is made by a review of your medical history and a physical exam. Tests may also be performed. These may include:  Lung function studies. These tests show how much air you breathe in and out.  Allergy  tests.  Imaging tests such as X-rays. TREATMENT  Asthma cannot be cured, but it can usually be controlled. Treatment involves identifying and avoiding your asthma triggers. It also involves medicines. There are 2 classes of medicine used for asthma treatment:   Controller medicines. These prevent asthma symptoms from occurring. They are usually taken every day.  Reliever or rescue medicines. These quickly relieve asthma symptoms. They are used as needed and provide short-term relief. Your health care provider will help you create an asthma action plan. An asthma action plan is a written plan for managing and treating your asthma attacks. It includes a list of your asthma triggers and how they may be avoided. It also includes information on when medicines should be taken and when their dosage should be changed. An action plan may also involve the use of a device called a peak flow meter. A peak flow meter measures how well the lungs are working. It helps you monitor your condition. HOME CARE INSTRUCTIONS   Take medicines only as directed by your health care provider. Speak with your health care provider if you have questions about how or when to take the medicines.  Use a peak flow meter as directed by your health care provider. Record and keep track of readings.  Understand and use the action plan to help minimize or stop an asthma attack without needing to seek medical care.  Control your home environment in the following ways to help prevent asthma attacks:  Do not smoke. Avoid being exposed to secondhand smoke.  Change your heating and air conditioning filter regularly.  Limit your use of fireplaces and wood stoves.  Get rid of pests (such as roaches and   mice) and their droppings.  Throw away plants if you see mold on them.  Clean your floors and dust regularly. Use unscented cleaning products.  Try to have someone else vacuum for you regularly. Stay out of rooms while they are  being vacuumed and for a short while afterward. If you vacuum, use a dust mask from a hardware store, a double-layered or microfilter vacuum cleaner bag, or a vacuum cleaner with a HEPA filter.  Replace carpet with wood, tile, or vinyl flooring. Carpet can trap dander and dust.  Use allergy-proof pillows, mattress covers, and box spring covers.  Wash bed sheets and blankets every week in hot water and dry them in a dryer.  Use blankets that are made of polyester or cotton.  Clean bathrooms and kitchens with bleach. If possible, have someone repaint the walls in these rooms with mold-resistant paint. Keep out of the rooms that are being cleaned and painted.  Wash hands frequently. SEEK MEDICAL CARE IF:   You have wheezing, shortness of breath, or a cough even if taking medicine to prevent attacks.  The colored mucus you cough up (sputum) is thicker than usual.  Your sputum changes from clear or white to yellow, green, gray, or bloody.  You have any problems that may be related to the medicines you are taking (such as a rash, itching, swelling, or trouble breathing).  You are using a reliever medicine more than 2-3 times per week.  Your peak flow is still at 50-79% of your personal best after following your action plan for 1 hour.  You have a fever. SEEK IMMEDIATE MEDICAL CARE IF:   You seem to be getting worse and are unresponsive to treatment during an asthma attack.  You are short of breath even at rest.  You get short of breath when doing very little physical activity.  You have difficulty eating, drinking, or talking due to asthma symptoms.  You develop chest pain.  You develop a fast heartbeat.  You have a bluish color to your lips or fingernails.  You are light-headed, dizzy, or faint.  Your peak flow is less than 50% of your personal best. MAKE SURE YOU:   Understand these instructions.  Will watch your condition.  Will get help right away if you are not  doing well or get worse. Document Released: 10/30/2005 Document Revised: 03/16/2014 Document Reviewed: 05/29/2013 [employer] Patient Information 2015 [employer]. This information is not intended to replace advice given to you by your health care provider. Make sure you discuss any questions you have with your health care provider.   Asthma Attack Prevention Although there is no way to prevent asthma from starting, you can take steps to control the disease and reduce its symptoms. Learn about your asthma and how to control it. Take an active role to control your asthma by working with your health care provider to create and follow an asthma action plan. An asthma action plan guides you in:  Taking your medicines properly.  Avoiding things that set off your asthma or make your asthma worse (asthma triggers).  Tracking your level of asthma control.  Responding to worsening asthma.  Seeking emergency care when needed. To track your asthma, keep records of your symptoms, check your peak flow number using a handheld device that shows how well air moves out of your lungs (peak flow meter), and get regular asthma checkups.  WHAT ARE SOME WAYS TO PREVENT AN ASTHMA ATTACK?  Take medicines as directed by your   health care provider.  Keep track of your asthma symptoms and level of control.  With your health care provider, write a detailed plan for taking medicines and managing an asthma attack. Then be sure to follow your action plan. Asthma is an ongoing condition that needs regular monitoring and treatment.  Identify and avoid asthma triggers. Many outdoor allergens and irritants (such as pollen, mold, cold air, and air pollution) can trigger asthma attacks. Find out what your asthma triggers are and take steps to avoid them.  Monitor your breathing. Learn to recognize warning signs of an attack, such as coughing, wheezing, or shortness of breath. Your lung function may decrease before you notice  any signs or symptoms, so regularly measure and record your peak airflow with a home peak flow meter.  Identify and treat attacks early. If you act quickly, you are less likely to have a severe attack. You will also need less medicine to control your symptoms. When your peak flow measurements decrease and alert you to an upcoming attack, take your medicine as instructed and immediately stop any activity that may have triggered the attack. If your symptoms do not improve, get medical help.  Pay attention to increasing quick-relief inhaler use. If you find yourself relying on your quick-relief inhaler, your asthma is not under control. See your health care provider about adjusting your treatment. WHAT CAN MAKE MY SYMPTOMS WORSE? A number of common things can set off or make your asthma symptoms worse and cause temporary increased inflammation of your airways. Keep track of your asthma symptoms for several weeks, detailing all the environmental and emotional factors that are linked with your asthma. When you have an asthma attack, go back to your asthma diary to see which factor, or combination of factors, might have contributed to it. Once you know what these factors are, you can take steps to control many of them. If you have allergies and asthma, it is important to take asthma prevention steps at home. Minimizing contact with the substance to which you are allergic will help prevent an asthma attack. Some triggers and ways to avoid these triggers are: Animal Dander:  Some people are allergic to the flakes of skin or dried saliva from animals with fur or feathers.   There is no such thing as a hypoallergenic dog or cat breed. All dogs or cats can cause allergies, even if they don't shed.  Keep these pets out of your home.  If you are not able to keep a pet outdoors, keep the pet out of your bedroom and other sleeping areas at all times, and keep the door closed.  Remove carpets and furniture covered  with cloth from your home. If that is not possible, keep the pet away from fabric-covered furniture and carpets. Dust Mites: Many people with asthma are allergic to dust mites. Dust mites are tiny bugs that are found in every home in mattresses, pillows, carpets, fabric-covered furniture, bedcovers, clothes, stuffed toys, and other fabric-covered items.   Cover your mattress in a special dust-proof cover.  Cover your pillow in a special dust-proof cover, or wash the pillow each week in hot water. Water must be hotter than 130 F (54.4 C) to kill dust mites. Cold or warm water used with detergent and bleach can also be effective.  Wash the sheets and blankets on your bed each week in hot water.  Try not to sleep or lie on cloth-covered cushions.  Call ahead when traveling and ask for a   smoke-free hotel room. Bring your own bedding and pillows in case the hotel only supplies feather pillows and down comforters, which may contain dust mites and cause asthma symptoms.  Remove carpets from your bedroom and those laid on concrete, if you can.  Keep stuffed toys out of the bed, or wash the toys weekly in hot water or cooler water with detergent and bleach. Cockroaches: Many people with asthma are allergic to the droppings and remains of cockroaches.   Keep food and garbage in closed containers. Never leave food out.  Use poison baits, traps, powders, gels, or paste (for example, boric acid).  If a spray is used to kill cockroaches, stay out of the room until the odor goes away. Indoor Mold:  Fix leaky faucets, pipes, or other sources of water that have mold around them.  Clean floors and moldy surfaces with a fungicide or diluted bleach.  Avoid using humidifiers, vaporizers, or swamp coolers. These can spread molds through the air. Pollen and Outdoor Mold:  When pollen or mold spore counts are high, try to keep your windows closed.  Stay indoors with windows closed from late morning to  afternoon. Pollen and some mold spore counts are highest at that time.  Ask your health care provider whether you need to take anti-inflammatory medicine or increase your dose of the medicine before your allergy season starts. Other Irritants to Avoid:  Tobacco smoke is an irritant. If you smoke, ask your health care provider how you can quit. Ask family members to quit smoking, too. Do not allow smoking in your home or car.  If possible, do not use a wood-burning stove, kerosene heater, or fireplace. Minimize exposure to all sources of smoke, including incense, candles, fires, and fireworks.  Try to stay away from strong odors and sprays, such as perfume, talcum powder, hair spray, and paints.  Decrease humidity in your home and use an indoor air cleaning device. Reduce indoor humidity to below 60%. Dehumidifiers or central air conditioners can do this.  Decrease house dust exposure by changing furnace and air cooler filters frequently.  Try to have someone else vacuum for you once or twice a week. Stay out of rooms while they are being vacuumed and for a short while afterward.  If you vacuum, use a dust mask from a hardware store, a double-layered or microfilter vacuum cleaner bag, or a vacuum cleaner with a HEPA filter.  Sulfites in foods and beverages can be irritants. Do not drink beer or wine or eat dried fruit, processed potatoes, or shrimp if they cause asthma symptoms.  Cold air can trigger an asthma attack. Cover your nose and mouth with a scarf on cold or windy days.  Several health conditions can make asthma more difficult to manage, including a runny nose, sinus infections, reflux disease, psychological stress, and sleep apnea. Work with your health care provider to manage these conditions.  Avoid close contact with people who have a respiratory infection such as a cold or the flu, since your asthma symptoms may get worse if you catch the infection. Wash your hands thoroughly  after touching items that may have been handled by people with a respiratory infection.  Get a flu shot every year to protect against the flu virus, which often makes asthma worse for days or weeks. Also get a pneumonia shot if you have not previously had one. Unlike the flu shot, the pneumonia shot does not need to be given yearly. Medicines:  Talk to your   health care provider about whether it is safe for you to take aspirin or non-steroidal anti-inflammatory medicines (NSAIDs). In a small number of people with asthma, aspirin and NSAIDs can cause asthma attacks. These medicines must be avoided by people who have known aspirin-sensitive asthma. It is important that people with aspirin-sensitive asthma read labels of all over-the-counter medicines used to treat pain, colds, coughs, and fever.  Beta-blockers and ACE inhibitors are other medicines you should discuss with your health care provider. HOW CAN I FIND OUT WHAT I AM ALLERGIC TO? Ask your asthma health care provider about allergy skin testing or blood testing (the RAST test) to identify the allergens to which you are sensitive. If you are found to have allergies, the most important thing to do is to try to avoid exposure to any allergens that you are sensitive to as much as possible. Other treatments for allergies, such as medicines and allergy shots (immunotherapy) are available.  CAN I EXERCISE? Follow your health care provider's advice regarding asthma treatment before exercising. It is important to maintain a regular exercise program, but vigorous exercise or exercise in cold, humid, or dry environments can cause asthma attacks, especially for those people who have exercise-induced asthma. Document Released: 10/18/2009 Document Revised: 11/04/2013 Document Reviewed: 05/07/2013 Exit[employer] Patient Information 2015 [employer]. This information is not intended to replace advice given to you by your health care provider. Make sure you discuss  any questions you have with your health care provider.  

## 2015-01-11 NOTE — Telephone Encounter (Signed)
Left message on voicemail with message from MD. 

## 2015-01-14 ENCOUNTER — Encounter: Payer: Self-pay | Admitting: Family Medicine

## 2015-01-14 ENCOUNTER — Ambulatory Visit (INDEPENDENT_AMBULATORY_CARE_PROVIDER_SITE_OTHER): Payer: Medicaid Other | Admitting: Family Medicine

## 2015-01-14 VITALS — BP 133/83 | HR 84 | Temp 98.1°F | Ht 62.0 in | Wt 187.0 lb

## 2015-01-14 DIAGNOSIS — J4540 Moderate persistent asthma, uncomplicated: Secondary | ICD-10-CM

## 2015-01-14 NOTE — Assessment & Plan Note (Signed)
Currently not well controlled, with slight exacerbation. Resume Qvar twice a day Continue albuterol when necessary Lungs mostly clear and patient breathing comfortably - no indication for another course of prednisone at this time Warning symptoms given Follow-up in one month with PCP

## 2015-01-14 NOTE — Patient Instructions (Signed)
It was nice to meet you today. It is important for you to take your Qvar every day to control your asthma. When you start taking this again, you should start using your albuterol less.'  Follow-up with your primary care doctor in one month.  Take care, Dr. B  Asthma Asthma is a recurring condition in which the airways tighten and narrow. Asthma can make it difficult to breathe. It can cause coughing, wheezing, and shortness of breath. Asthma episodes, also called asthma attacks, range from minor to life-threatening. Asthma cannot be cured, but medicines and lifestyle changes can help control it. CAUSES Asthma is believed to be caused by inherited (genetic) and environmental factors, but its exact cause is unknown. Asthma may be triggered by allergens, lung infections, or irritants in the air. Asthma triggers are different for each person. Common triggers include:   Animal dander.  Dust mites.  Cockroaches.  Pollen from trees or grass.  Mold.  Smoke.  Air pollutants such as dust, household cleaners, hair sprays, aerosol sprays, paint fumes, strong chemicals, or strong odors.  Cold air, weather changes, and winds (which increase molds and pollens in the air).  Strong emotional expressions such as crying or laughing hard.  Stress.  Certain medicines (such as aspirin) or types of drugs (such as beta-blockers).  Sulfites in foods and drinks. Foods and drinks that may contain sulfites include dried fruit, potato chips, and sparkling grape juice.  Infections or inflammatory conditions such as the flu, a cold, or an inflammation of the nasal membranes (rhinitis).  Gastroesophageal reflux disease (GERD).  Exercise or strenuous activity. SYMPTOMS Symptoms may occur immediately after asthma is triggered or many hours later. Symptoms include:  Wheezing.  Excessive nighttime or early morning coughing.  Frequent or severe coughing with a common cold.  Chest tightness.  Shortness  of breath. DIAGNOSIS  The diagnosis of asthma is made by a review of your medical history and a physical exam. Tests may also be performed. These may include:  Lung function studies. These tests show how much air you breathe in and out.  Allergy tests.  Imaging tests such as X-rays. TREATMENT  Asthma cannot be cured, but it can usually be controlled. Treatment involves identifying and avoiding your asthma triggers. It also involves medicines. There are 2 classes of medicine used for asthma treatment:   Controller medicines. These prevent asthma symptoms from occurring. They are usually taken every day.  Reliever or rescue medicines. These quickly relieve asthma symptoms. They are used as needed and provide short-term relief. Your health care provider will help you create an asthma action plan. An asthma action plan is a written plan for managing and treating your asthma attacks. It includes a list of your asthma triggers and how they may be avoided. It also includes information on when medicines should be taken and when their dosage should be changed. An action plan may also involve the use of a device called a peak flow meter. A peak flow meter measures how well the lungs are working. It helps you monitor your condition. HOME CARE INSTRUCTIONS   Take medicines only as directed by your health care provider. Speak with your health care provider if you have questions about how or when to take the medicines.  Use a peak flow meter as directed by your health care provider. Record and keep track of readings.  Understand and use the action plan to help minimize or stop an asthma attack without needing to seek medical care.  Control your home environment in the following ways to help prevent asthma attacks:  Do not smoke. Avoid being exposed to secondhand smoke.  Change your heating and air conditioning filter regularly.  Limit your use of fireplaces and wood stoves.  Get rid of pests (such  as roaches and mice) and their droppings.  Throw away plants if you see mold on them.  Clean your floors and dust regularly. Use unscented cleaning products.  Try to have someone else vacuum for you regularly. Stay out of rooms while they are being vacuumed and for a short while afterward. If you vacuum, use a dust mask from a hardware store, a double-layered or microfilter vacuum cleaner bag, or a vacuum cleaner with a HEPA filter.  Replace carpet with wood, tile, or vinyl flooring. Carpet can trap dander and dust.  Use allergy-proof pillows, mattress covers, and box spring covers.  Wash bed sheets and blankets every week in hot water and dry them in a dryer.  Use blankets that are made of polyester or cotton.  Clean bathrooms and kitchens with bleach. If possible, have someone repaint the walls in these rooms with mold-resistant paint. Keep out of the rooms that are being cleaned and painted.  Wash hands frequently. SEEK MEDICAL CARE IF:   You have wheezing, shortness of breath, or a cough even if taking medicine to prevent attacks.  The colored mucus you cough up (sputum) is thicker than usual.  Your sputum changes from clear or white to yellow, green, gray, or bloody.  You have any problems that may be related to the medicines you are taking (such as a rash, itching, swelling, or trouble breathing).  You are using a reliever medicine more than 2-3 times per week.  Your peak flow is still at 50-79% of your personal best after following your action plan for 1 hour.  You have a fever. SEEK IMMEDIATE MEDICAL CARE IF:   You seem to be getting worse and are unresponsive to treatment during an asthma attack.  You are short of breath even at rest.  You get short of breath when doing very little physical activity.  You have difficulty eating, drinking, or talking due to asthma symptoms.  You develop chest pain.  You develop a fast heartbeat.  You have a bluish color to your  lips or fingernails.  You are light-headed, dizzy, or faint.  Your peak flow is less than 50% of your personal best. MAKE SURE YOU:   Understand these instructions.  Will watch your condition.  Will get help right away if you are not doing well or get worse. Document Released: 10/30/2005 Document Revised: 03/16/2014 Document Reviewed: 05/29/2013 [facility] Patient Information 2015 [place], [place]. This information is not intended to replace advice given to you by your health care provider. Make sure you discuss any questions you have with your health care provider.

## 2015-01-14 NOTE — Progress Notes (Signed)
   Subjective:   Jill Reynolds is a 19 y.o. female with a history of asthma, allergic rhinitis here for asthma f/u.  Patient reports that her asthma has been getting worse over the past 2-3 weeks. She has similar issues at the end of January, at which time she was seen at urgent care got a nebulizer was prescribed a five-day course of prednisone. She started taking her Qvar again after her urgent care visit. She recently ran out of her Qvar around the time that her asthma started getting worse 2-3 weeks ago. She also recently stopped getting her allergy shots which had improved her asthma previously. She reports she is waking up in the middle night coughing intermittently. She also has intermittent wheezing. She is taking her albuterol 3 puffs every 3-4 hours. Her asthma is worse with PE class.  Review of Systems:  Per HPI. All other systems reviewed and are negative.   PMH, PSH, Medications, Allergies, and FmHx reviewed and updated in EMR.  Social History: never smoker  Objective:  BP 133/83 mmHg  Pulse 84  Temp(Src) 98.1 F (36.7 C) (Oral)  Ht 5\' 2"  (1.575 m)  Wt 187 lb (84.823 kg)  BMI 34.19 kg/m2  LMP 12/24/2014  Gen:  19 y.o. female in NAD HEENT: NCAT, MMM CV: RRR, no MRG, Resp: Non-labored, CTAB except mild end expiratory wheezing Ext: WWP, no edema Neuro: Alert and oriented, speech normal  Assessment:     Jill Reynolds is a 19 y.o. female here for asthma f/u.    Plan:     See problem list for problem-specific plans.   Shirlee LatchAngela Bacigalupo, MD PGY-1,  [facility] 01/14/2015  4:23 PM

## 2015-02-01 NOTE — Progress Notes (Signed)
I was the preceptor for this encounter. Jami Bogdanski, M.D. 

## 2015-02-01 NOTE — Progress Notes (Signed)
I was the preceptor for this encounter. Almetta Liddicoat, M.D. 

## 2015-12-30 ENCOUNTER — Other Ambulatory Visit: Payer: Self-pay | Admitting: *Deleted

## 2015-12-30 DIAGNOSIS — J4520 Mild intermittent asthma, uncomplicated: Secondary | ICD-10-CM

## 2015-12-31 MED ORDER — ALBUTEROL SULFATE HFA 108 (90 BASE) MCG/ACT IN AERS: 2.0000 | INHALATION_SPRAY | RESPIRATORY_TRACT | Status: DC | PRN

## 2016-06-05 ENCOUNTER — Other Ambulatory Visit: Payer: Self-pay | Admitting: Allergy and Immunology

## 2016-10-23 ENCOUNTER — Ambulatory Visit (INDEPENDENT_AMBULATORY_CARE_PROVIDER_SITE_OTHER): Payer: Medicaid Other | Admitting: Family Medicine

## 2016-10-23 ENCOUNTER — Other Ambulatory Visit (HOSPITAL_COMMUNITY)
Admission: RE | Admit: 2016-10-23 | Discharge: 2016-10-23 | Disposition: A | Discharge status: Home / Self Care | Payer: Medicaid Other | Source: Ambulatory Visit | Attending: Family Medicine | Admitting: Family Medicine

## 2016-10-23 ENCOUNTER — Encounter: Payer: Self-pay | Admitting: Family Medicine

## 2016-10-23 VITALS — BP 130/80 | HR 94 | Temp 98.1°F | Wt 207.0 lb

## 2016-10-23 DIAGNOSIS — Z87892 Personal history of anaphylaxis: Secondary | ICD-10-CM

## 2016-10-23 DIAGNOSIS — F5089 Other specified eating disorder: Secondary | ICD-10-CM

## 2016-10-23 DIAGNOSIS — L209 Atopic dermatitis, unspecified: Secondary | ICD-10-CM

## 2016-10-23 DIAGNOSIS — Z113 Encounter for screening for infections with a predominantly sexual mode of transmission: Secondary | ICD-10-CM | POA: Insufficient documentation

## 2016-10-23 DIAGNOSIS — J301 Allergic rhinitis due to pollen: Secondary | ICD-10-CM | POA: Diagnosis not present

## 2016-10-23 DIAGNOSIS — J4540 Moderate persistent asthma, uncomplicated: Secondary | ICD-10-CM

## 2016-10-23 LAB — COMPLETE METABOLIC PANEL WITH GFR
ALT: 9 U/L (ref 6–29)
AST: 18 U/L (ref 10–30)
Albumin: 4.1 g/dL (ref 3.6–5.1)
Alkaline Phosphatase: 39 U/L (ref 33–115)
BILIRUBIN TOTAL: 0.3 mg/dL (ref 0.2–1.2)
BUN: 10 mg/dL (ref 7–25)
CALCIUM: 9.2 mg/dL (ref 8.6–10.2)
CHLORIDE: 105 mmol/L (ref 98–110)
CO2: 23 mmol/L (ref 20–31)
CREATININE: 0.75 mg/dL (ref 0.50–1.10)
GFR, Est Non African American: >89 mL/min (ref >=60)
Glucose, Bld: 89 mg/dL (ref 65–99)
Potassium: 4 mmol/L (ref 3.5–5.3)
Sodium: 139 mmol/L (ref 135–146)
TOTAL PROTEIN: 6.7 g/dL (ref 6.1–8.1)

## 2016-10-23 LAB — CBC
HEMATOCRIT: 26.5 % — AB (ref 35.0–45.0)
Hemoglobin: 7.4 g/dL — ABNORMAL LOW (ref 11.7–15.5)
MCH: 15.5 pg — ABNORMAL LOW (ref 27.0–33.0)
MCHC: 27.9 g/dL — AB (ref 32.0–36.0)
MCV: 55.6 fL — AB (ref 80.0–100.0)
PLATELETS: 488 10*3/uL — AB (ref 140–400)
RBC: 4.77 MIL/uL (ref 3.80–5.10)
RDW: 21 % — AB (ref 11.0–15.0)
WBC: 5.4 10*3/uL (ref 3.8–10.8)

## 2016-10-23 LAB — FERRITIN: Ferritin: 4 ng/mL — ABNORMAL LOW (ref 10–154)

## 2016-10-23 LAB — POCT WET PREP (WET MOUNT)
CLUE CELLS WET PREP WHIFF POC: NEGATIVE
Trichomonas Wet Prep HPF POC: ABSENT

## 2016-10-23 LAB — IRON AND TIBC
%SAT: 3 % — ABNORMAL LOW (ref 11–50)
Iron: 15 ug/dL — ABNORMAL LOW (ref 40–190)
TIBC: 441 ug/dL (ref 250–450)
UIBC: 426 ug/dL — ABNORMAL HIGH (ref 125–400)

## 2016-10-23 LAB — TSH: TSH: 2.58 m[IU]/L

## 2016-10-23 LAB — POCT URINE PREGNANCY: PREG TEST UR: NEGATIVE

## 2016-10-23 MED ORDER — HYDROCORTISONE 2.5 % EX CREA: TOPICAL_CREAM | Freq: Two times a day (BID) | CUTANEOUS | 1 refills | Status: DC

## 2016-10-23 MED ORDER — EPINEPHRINE 0.3 MG/0.3ML IJ SOAJ: 0.3000 mg | Freq: Once | INTRAMUSCULAR | 0 refills | Status: AC

## 2016-10-23 MED ORDER — MONTELUKAST SODIUM 10 MG PO TABS: 10.0000 mg | ORAL_TABLET | Freq: Every day | ORAL | 3 refills | Status: DC

## 2016-10-23 NOTE — Assessment & Plan Note (Signed)
Symptoms suggestive of pica from iron deficiency - though interestingly she denies having heavy bleeding with periods. - check CBC, CMET, iron studies, TSH today

## 2016-10-23 NOTE — Patient Instructions (Signed)
STD testing: - checking for full STD's today - I'll call you with results  For pica & fatigue: - checking blood counts and iron levels today - also checking kidneys, liver, and thyroid  For allergies: - add singulair 10mg  daily - sent this in - follow up with me in a few weeks to discuss this more  For eczema: - continue as needed hydrocortisone, singulair may help some - follow up with me in a few weeks to discuss this more  Schedule visit in several weeks to follow up, also to discuss other concerns (ringing in ears, heartburn) which we didn't have time to address today.  Be well, Dr. Pollie MeyerMcIntyre

## 2016-10-23 NOTE — Assessment & Plan Note (Signed)
Flaring. Continue zyrtec, add singulair - may also help with eczema.

## 2016-10-23 NOTE — Assessment & Plan Note (Signed)
Refill hydrocortisone. Adding singulair for better control of allergies & eczema

## 2016-10-23 NOTE — Progress Notes (Signed)
Date of Visit: 10/23/2016   HPI:  Patient presents to discuss a variety of complaints.  STD check - having discharge, more than normal. It's white, somewhat clumpy. No odor or itching. Sexually active with one female partner in the last year. Uses condoms but not every time. No pelvic pain. She is not interested in any other forms of birth control.   Pica - has been craving ice, medicated powder, and baby powder. Dips her tongue in baby powder some. Feels sleepy a lot, also always feels cold. Eating well. LMP was last week. Periods occur roughly every month and are not very heavy. Last about 2 days, and are light. She thinks her iron levels are low.  Eczema - feels like it's about to flare. Has her own hydrocortisone cream. Also uses mom's triamcinolone  Allergies - taking cetirizine 10mg  daily. Previously prescribed flonase, but didn't like it. Does not want to take this again. Feels like allergies are flaring.  Asthma - Has history of asthma, but has not needed albuterol in months. Not taking qvar.   Patient also wanted to discuss heartburn and her ears ringing, but she will schedule a separate appointment to talk about these, as we did not have the time to address all these complaints today. Visit agenda was negotiated & we discussed complaints that were most important to patient today.  ROS: See HPI.  PMFSH: history of allergic rhinitis, asthma, eczema, sickle cell trait  PHYSICAL EXAM: BP 130/80   Pulse 94   Temp 98.1 F (36.7 C) (Oral)   Wt 207 lb (93.9 kg)   LMP 10/16/2016   SpO2 99%   BMI 37.86 kg/m  Gen: NAD, pleasant, cooperative HEENT: normocephalic, atraumatic, moist mucous membranes. Ears with cerumen present, one ear entirely impacted. Nares patent. Oropharynx clear and moist. No conjunctival or subungual pallor appreciated. Thyroid symmetric without nodules or tenderness. Heart: regular rate and rhythm, no murmur Lungs: clear to auscultation bilaterally, normal work of  breathing  Neuro: alert grossly nonfocal, speech normal Ext: No appreciable lower extremity edema bilaterally  GU: normal appearing external genitalia without lesions. Vagina is moist with white discharge. Cervix normal in appearance. No cervical motion tenderness or tenderness on bimanual exam. No adnexal masses.   ASSESSMENT/PLAN:  Health maintenance:  -declines pap smear today - has upcoming appointment with GYN and wants to get it there -declines flu shot today -declines Tdap today, states she will consider it at follow up visit.   Allergic rhinitis due to allergen Flaring. Continue zyrtec, add singulair - may also help with eczema.   Asthma, moderate persistent Well controlled, no albuterol needed in several months. Adding singulair today for better allergy & eczema control.  Dermatitis, atopic Refill hydrocortisone. Adding singulair for better control of allergies & eczema  Pica Symptoms suggestive of pica from iron deficiency - though interestingly she denies having heavy bleeding with periods. - check CBC, CMET, iron studies, TSH today  STD check - will check gc/chlamydia/trichomonas, HIV, RPR today per patient request - also check wet prep given vaginal discharge  - upreg negative  History of anaphylaxis - new epipen sent in for patient to have on hand  FOLLOW UP: Follow up in 1-2 weeks for above issues & to discuss heartburn & ears ringing  Jill J. Pollie MeyerMcIntyre, MD [facility]

## 2016-10-23 NOTE — Assessment & Plan Note (Signed)
Well controlled, no albuterol needed in several months. Adding singulair today for better allergy & eczema control.

## 2016-10-24 ENCOUNTER — Telehealth: Payer: Self-pay | Admitting: Family Medicine

## 2016-10-24 DIAGNOSIS — D509 Iron deficiency anemia, unspecified: Secondary | ICD-10-CM | POA: Insufficient documentation

## 2016-10-24 LAB — RPR

## 2016-10-24 LAB — HIV ANTIBODY (ROUTINE TESTING W REFLEX): HIV 1&2 Ab, 4th Generation: NONREACTIVE

## 2016-10-24 MED ORDER — FERROUS SULFATE 325 (65 FE) MG PO TABS: 325.0000 mg | ORAL_TABLET | Freq: Two times a day (BID) | ORAL | 3 refills | Status: DC

## 2016-10-24 MED ORDER — DOCUSATE SODIUM 100 MG PO CAPS: 100.0000 mg | ORAL_CAPSULE | Freq: Two times a day (BID) | ORAL | 0 refills | Status: DC | PRN

## 2016-10-24 NOTE — Telephone Encounter (Signed)
Pt returned call. I paged  Dr. Pollie MeyerMcIntyre. Pt can be reached [phone]. Please advise. Thanks! ep

## 2016-10-24 NOTE — Telephone Encounter (Signed)
Attempted to reach patient to discuss lab results. No answer, left voicemail asking her to call back on both home & mobile numbers.  Hemoglobin quite low at 7.4, with iron studies suggesting iron deficiency anemia. She will at minimum need to take iron supplementation, but I need to speak with her more to determine the cause of her iron levels being so low.   Please page me when she calls back so that I can speak with her.  Latrelle DodrillBrittany J McIntyre, MD

## 2016-10-24 NOTE — Telephone Encounter (Signed)
I never got the page as the front desk had an incorrect pager number for me - this is being fixed.  Returned call to patient and was able to reach her.  Discussed results of labs, which showed significant iron deficiency anemia.  Got some more history from her over the phone: - denies any blood in stool, hematemesis, hematuria. No dark or tarry stools. Bowels move normally - feels very tired, but not having any shortness of breath (notes history of asthma her whole life, but has no shortness of breath that stands out). Denies chest pain. - has felt this way since her daughter was born several years ago (including pica, ongoing for years) - denies fevers, weight loss. Has actually had weight gain - uses NSAIDs only occasionally, last use was about a month ago for a toothache - menses last only 2 days and are relatively light - has family history of iron deficiency in her mother. No family history of bleeding issues, celiac, colon cancer, or GI disorders. Grandmothers both have diabetes, diagnosed as adults, likely type 2. Mom is currently undergoing testing for lupus. - no frequent blood donations or excessive exercise  Discussed options for treatment with patient including: 1. Admission to hospital for blood transfusion - patient wants to avoid this if possible 2. Short stay visit for IV iron infusion 3. Trial of oral iron therapy  Discussed that latter two options will likely take longer to make her feel better, with oral taking longer than IV iron. After discussion, she elected trial of oral iron therapy. She will follow up with me in 2 weeks to see how she's doing on this medicine. Will start ferrous sulfate 325mg  twice daily. rx colace in case constipation becomes an issue. Discussed return precautions or reasons to seek care in the ED (shortness of breath, etc).  Her menstrual history is not at all typical for causing this profound of iron deficiency anemia. I think GI workup is prudent.  Will refer to GI for evaluation. Patient agreeable to this plan.  Jill DodrillBrittany J Kehaulani Fruin, MD

## 2016-10-25 ENCOUNTER — Telehealth: Payer: Self-pay | Admitting: Family Medicine

## 2016-10-25 LAB — CERVICOVAGINAL ANCILLARY ONLY
Chlamydia: NEGATIVE
Neisseria Gonorrhea: NEGATIVE
Trichomonas: NEGATIVE

## 2016-10-25 NOTE — Telephone Encounter (Signed)
Attempted to reach patient to let her know that all her STD tests have come back negative. No answer, left voicemail asking her to return the call.  When she calls back please tell her that all STD tests have now resulted negative. This is good news.  Thanks, Jill DodrillBrittany J Cordarro Spinnato, MD

## 2016-10-27 ENCOUNTER — Encounter: Payer: Self-pay | Admitting: Internal Medicine

## 2016-10-27 NOTE — Telephone Encounter (Signed)
Pt was advised. ep °

## 2016-11-15 ENCOUNTER — Ambulatory Visit: Payer: Medicaid Other | Admitting: Obstetrics and Gynecology

## 2016-12-07 ENCOUNTER — Other Ambulatory Visit: Payer: Self-pay | Admitting: Family Medicine

## 2016-12-07 DIAGNOSIS — J4520 Mild intermittent asthma, uncomplicated: Secondary | ICD-10-CM

## 2016-12-07 DIAGNOSIS — L209 Atopic dermatitis, unspecified: Secondary | ICD-10-CM

## 2016-12-07 NOTE — Telephone Encounter (Signed)
Needs refill on her inhaler.  Also needs refill on the cream for her eczema,  She uses tri-something instead of hydro cortizone cream.  [facility] [address]

## 2016-12-08 ENCOUNTER — Encounter: Payer: Self-pay | Admitting: Gastroenterology

## 2016-12-08 MED ORDER — ALBUTEROL SULFATE HFA 108 (90 BASE) MCG/ACT IN AERS: 2.0000 | INHALATION_SPRAY | RESPIRATORY_TRACT | 1 refills | Status: DC | PRN

## 2016-12-08 MED ORDER — HYDROCORTISONE 2.5 % EX CREA: TOPICAL_CREAM | Freq: Two times a day (BID) | CUTANEOUS | 1 refills | Status: DC

## 2016-12-13 ENCOUNTER — Encounter (INDEPENDENT_AMBULATORY_CARE_PROVIDER_SITE_OTHER): Payer: Self-pay

## 2016-12-13 ENCOUNTER — Ambulatory Visit (INDEPENDENT_AMBULATORY_CARE_PROVIDER_SITE_OTHER): Payer: Medicaid Other | Admitting: Internal Medicine

## 2016-12-13 ENCOUNTER — Other Ambulatory Visit (INDEPENDENT_AMBULATORY_CARE_PROVIDER_SITE_OTHER): Payer: Medicaid Other

## 2016-12-13 ENCOUNTER — Encounter: Payer: Self-pay | Admitting: Internal Medicine

## 2016-12-13 VITALS — BP 110/68 | HR 80 | Ht 62.0 in | Wt 208.0 lb

## 2016-12-13 DIAGNOSIS — D508 Other iron deficiency anemias: Secondary | ICD-10-CM | POA: Diagnosis not present

## 2016-12-13 DIAGNOSIS — R1032 Left lower quadrant pain: Secondary | ICD-10-CM

## 2016-12-13 LAB — CBC WITH DIFFERENTIAL/PLATELET
BASOS ABS: 0 10*3/uL (ref 0.0–0.1)
Basophils Relative: 0.6 % (ref 0.0–3.0)
EOS ABS: 0.5 10*3/uL (ref 0.0–0.7)
Eosinophils Relative: 7.1 % — ABNORMAL HIGH (ref 0.0–5.0)
HCT: 33.7 % — ABNORMAL LOW (ref 36.0–46.0)
Hemoglobin: 10.9 g/dL — ABNORMAL LOW (ref 12.0–15.0)
LYMPHS ABS: 1.9 10*3/uL (ref 0.7–4.0)
Lymphocytes Relative: 26.4 % (ref 12.0–46.0)
MCHC: 32.3 g/dL (ref 30.0–36.0)
MCV: 62.8 fl — ABNORMAL LOW (ref 78.0–100.0)
Monocytes Absolute: 0.4 10*3/uL (ref 0.1–1.0)
Monocytes Relative: 5.7 % (ref 3.0–12.0)
Neutro Abs: 4.4 10*3/uL (ref 1.4–7.7)
Neutrophils Relative %: 60.2 % (ref 43.0–77.0)
PLATELETS: 310 10*3/uL (ref 150.0–400.0)
RBC: 5.43 Mil/uL — ABNORMAL HIGH (ref 3.87–5.11)
RDW: 31.2 % — AB (ref 11.5–15.5)
WBC: 7.3 10*3/uL (ref 4.0–10.5)

## 2016-12-13 NOTE — Patient Instructions (Signed)
   I suspect that menstrual blood loss and diet are why the iron and hemoglobin are low (anemia).  See if you can find and take ferrous gluconate tablets at least 1 x a day.  We will do a blood count today and let you know results.  Please be sure to go back to Levert FeinsteinBrittany McIntyre, MD  I appreciate the opportunity to care for you. Jill Booparl E. Gessner, MD, Clementeen GrahamFACG

## 2016-12-13 NOTE — Progress Notes (Signed)
Jill Reynolds 20 y.o. 1995-12-03 [id number] Referred by: Latrelle Dodrill, MD [address] [place], [place] [address]   Assessment & Plan:   Encounter Diagnoses  Name Primary?  . iron deficiency anemia suspect nutrional + menstrual in origin Yes  . LLQ pain     - No evidence of GI involvement as cause of iron deficiency anemia. Further GI workup does not seem necessary at this point. - Iron deficiency anemia is likely associated with menstrual blood loss and nutritional deficiencies.  - Obtained CBC for reevaluation of iron and hemoglobin values. Will contact patient about results when available.  - Recommended trying ferrous gluconate in substitution for ferrous sulfate. - Abdominal pain is intermittent and fleeting. Mild symptoms are not suggestive of need for more thorough evaluation. Patient advised that if symptoms worsen to contact office.  -- Follow-up with PCP for anemia management.    Ivar Drape PA-S  I have seen the patient with Ms. Jill Reynolds and she has served as a Neurosurgeon. Jill Boop, MD, [facility]  I appreciate the opportunity to care for this patient. XB:[id number] Pollie Meyer, MD  Lab Results  Component Value Date   WBC 7.3 12/13/2016   HGB 10.9 (L) 12/13/2016   HCT 33.7 (L) 12/13/2016   MCV 62.8 Repeated and verified X2. (L) 12/13/2016   PLT 310.0 12/13/2016   Hgb improved.  Subjective:   Chief Complaint: Iron deficiency Anemia  HPI 21 year old female was referred from PCP for GI evaluation of iron deficiency anemia. Iron levels on 10/23/2016 were significantly low at 15 with high TIBC and sat 3% and ferritin 4 . Hgb 7.4. Hx of anemia back to 2014 with Hgb 9-11 and low MCV - around time of childbirth.Patient was started on ferrous sulfate supplementation, but she quit taking the pills 2 weeks after starting them due to side effect of nausea. Patient states that she doesn't eat much meat because it gives her heartburn, but she has focused her diet on  iron-rich foods such as spinach and lentils. Symptoms of fatigue have slightly improved since her visit with PCP. Patient also complains of non-radiating left lower abdominal pain that started within the last couple of weeks. Pain is intermittent and self-resolving after a couple seconds. It occurs mostly at night, but it hasn't woken her up from sleep. Last episode was 2 days ago.  Denies constipation, diarrhea, nausea, vomiting, hematochezia, melena, menorrhagia, chest pain, or shortness of breath unrelated to asthma.   Allergies  Allergen Reactions  . Vicodin [Hydrocodone-Acetaminophen] Anaphylaxis  . Peanut-Containing Drug Products     Unknown reaction, allergy showed up in an allergy test   Current Meds  Medication Sig  . albuterol (VENTOLIN HFA) 108 (90 Base) MCG/ACT inhaler Inhale 2 puffs into the lungs every 4 (four) hours as needed.  . cetirizine (ZYRTEC) 10 MG tablet Take 1 tablet (10 mg total) by mouth daily.  Marland Kitchen EPINEPHrine 0.3 mg/0.3 mL IJ SOAJ injection as directed.  . triamcinolone cream (KENALOG) 0.1 % Apply 1 application topically 2 (two) times daily.   Past Medical History:  Diagnosis Date  . 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 NO PAST SURGERIES     Social History   Social History  . Marital status: Single    Spouse name: N/A  . Number of children: 1  . Years of education: N/A   Social History Main Topics  . Smoking status: Never Smoker  .  Smokeless tobacco: Never Used  . Alcohol use No  . Drug use: No  . Sexual activity: Yes    Partners: Male    Birth control/ protection: Condom   Other Topics Concern  . None   Social History Narrative  . None   Family History  Problem Relation Age of Onset  . Heart disease Mother   . Anemia Mother   . Hypertension Father    Review of Systems As above, all others negative  Objective:   Physical Exam BP 110/68   Pulse 80   Ht 5\' 2"  (1.575  m)   Wt 208 lb (94.3 kg)   LMP 11/13/2016   Breastfeeding? No   BMI 38.04 kg/m    GEN: Well-nourished, well-developed female in NAD - obese HEAD: normocephalic, atraumatic.  EYE: anicteric CV: S1,S2. Regular heart rate and rhythm. No murmurs, rubs, or gallops.  PULM: Lungs clear to auscultation bilaterally. No wheezes.  GI: mild tenderness to palpation in LLQ. Soft, non-distended, no guarding, no masses. Bowel sounds normoactive. LYMPH: no cervical nodes EXT: no lower extremity edema, no cyanosis or clubbing Psych: Appropriate and affect  Data reviewed:  PCP notes, labs in EMR

## 2016-12-13 NOTE — Progress Notes (Signed)
Let her know hemoglobin is improved - up to 10.9. Stay on iron and f/u PCP

## 2017-05-08 ENCOUNTER — Other Ambulatory Visit: Payer: Self-pay | Admitting: Family Medicine

## 2017-05-08 DIAGNOSIS — Z9109 Other allergy status, other than to drugs and biological substances: Secondary | ICD-10-CM

## 2017-05-08 NOTE — Telephone Encounter (Signed)
Needs refill on her allergy pills-cetirizne--[facility] [address]

## 2018-12-06 ENCOUNTER — Encounter: Payer: Self-pay | Admitting: Family Medicine

## 2019-05-31 ENCOUNTER — Telehealth: Payer: Self-pay | Admitting: Physician Assistant

## 2019-05-31 ENCOUNTER — Telehealth: Payer: Self-pay

## 2019-05-31 ENCOUNTER — Encounter (INDEPENDENT_AMBULATORY_CARE_PROVIDER_SITE_OTHER): Payer: Self-pay

## 2019-05-31 DIAGNOSIS — D573 Sickle-cell trait: Secondary | ICD-10-CM

## 2019-05-31 DIAGNOSIS — Z20822 Contact with and (suspected) exposure to covid-19: Secondary | ICD-10-CM

## 2019-05-31 NOTE — Progress Notes (Signed)
E-Visit for Corona Virus Screening   Your current symptoms could be consistent with the coronavirus.  Many health care providers can now test patients at their office but not all are.  [facility] has multiple testing sites. For information on our COVID testing locations and hours go to [email or web address]. I have placed a testing order so that you can be tested first thing Monday as our non ER testing sites are M-F only.   Please quarantine yourself while awaiting your test results.   COVID-19 is a respiratory illness with symptoms that are similar to the flu. Symptoms are typically mild to moderate, but there have been cases of severe illness and death due to the virus. The following symptoms may appear 2-14 days after exposure: . Fever . Cough . Shortness of breath or difficulty breathing . Chills . Repeated shaking with chills . Muscle pain . Headache . Sore throat . New loss of taste or smell . Fatigue . Congestion or runny nose . Nausea or vomiting . Diarrhea  It is vitally important that if you feel that you have an infection such as this virus or any other virus that you stay home and away from places where you may spread it to others.  You should self-quarantine for 14 days if you have symptoms that could potentially be coronavirus or have been in close contact a with a person diagnosed with COVID-19 within the last 2 weeks. You should avoid contact with people age 865 and older.   You should wear a mask or cloth face covering over your nose and mouth if you must be around other people or animals, including pets (even at home). Try to stay at least 6 feet away from other people. This will protect the people around you.  I am sending in a prescription for Zofran to help with nausea and vomiting. You may also take acetaminophen (Tylenol) as needed for fever.  I am enrolling you in a COVID symptom home monitoring program so we can keep a track of things.  You will receive a daily questionnaire regarding this.   Giving your history of sickle cell trait, if you note any worsening symptoms, please be seen at an Urgent Care or ER setting, letting them know you have suspected COVID.    Reduce your risk of any infection by using the same precautions used for avoiding the common cold or flu:  Marland Kitchen. Wash your hands often with soap and warm water for at least 20 seconds.  If soap and water are not readily available, use an alcohol-based hand sanitizer with at least 60% alcohol.  . If coughing or sneezing, cover your mouth and nose by coughing or sneezing into the elbow areas of your shirt or coat, into a tissue or into your sleeve (not your hands). . Avoid shaking hands with others and consider head nods or verbal greetings only. . Avoid touching your eyes, nose, or mouth with unwashed hands.  . Avoid close contact with people who are sick. . Avoid places or events with large numbers of people in one location, like concerts or sporting events. . Carefully consider travel plans you have or are making. . If you are planning any travel outside or inside the [place], visit the CDC's Travelers' Health webpage for the latest health notices. . If you have some symptoms but not all symptoms, continue to monitor at home and seek medical attention if your symptoms worsen. . If you are having a medical emergency, call 911.  HOME CARE . Only take medications as instructed by your medical team. . Drink plenty of fluids and get plenty of rest. . A steam or ultrasonic humidifier can help if you have congestion.   GET HELP RIGHT AWAY IF YOU HAVE EMERGENCY WARNING SIGNS** FOR COVID-19. If you or someone is showing any of these signs seek emergency medical care immediately. Call 911 or proceed to your closest emergency facility if: . You develop worsening high fever. . Trouble breathing . Bluish lips or face . Persistent pain or pressure in the chest . New  confusion . Inability to wake or stay awake . You cough up blood. . Your symptoms become more severe  **This list is not all possible symptoms. Contact your medical provider for any symptoms that are sever or concerning to you.   MAKE SURE YOU   Understand these instructions.  Will watch your condition.  Will get help right away if you are not doing well or get worse.  Your e-visit answers were reviewed by a board certified advanced clinical practitioner to complete your personal care plan.  Depending on the condition, your plan could have included both over the counter or prescription medications.  If there is a problem please reply once you have received a response from your provider.  Your safety is important to [place].  If you have drug allergies check your prescription carefully.    You can use MyChart to ask questions about today's visit, request a non-urgent call back, or ask for a work or school excuse for 24 hours related to this e-Visit. If it has been greater than 24 hours you will need to follow up with your provider, or enter a new e-Visit to address those concerns. You will get an e-mail in the next two days asking about your experience.  I hope that your e-visit has been valuable and will speed your recovery. Thank you for using e-visits.

## 2019-05-31 NOTE — Telephone Encounter (Signed)
Discussed SOB and vomiting- referenced MyChart protocols. Pt verbalized understanding.

## 2019-05-31 NOTE — Progress Notes (Signed)
I have spent 5 minutes in review of e-visit questionnaire, review and updating patient chart, medical decision making and response to patient.   Felipe Paluch Cody Saryiah Bencosme, PA-C    

## 2019-06-01 ENCOUNTER — Encounter (INDEPENDENT_AMBULATORY_CARE_PROVIDER_SITE_OTHER): Payer: Self-pay

## 2019-06-02 ENCOUNTER — Other Ambulatory Visit: Payer: Self-pay | Admitting: Family Medicine

## 2019-06-02 DIAGNOSIS — Z20822 Contact with and (suspected) exposure to covid-19: Secondary | ICD-10-CM

## 2019-06-04 ENCOUNTER — Encounter (INDEPENDENT_AMBULATORY_CARE_PROVIDER_SITE_OTHER): Payer: Self-pay

## 2019-06-05 LAB — NOVEL CORONAVIRUS, NAA: SARS-CoV-2, NAA: NOT DETECTED

## 2019-06-10 ENCOUNTER — Telehealth: Payer: Self-pay | Admitting: *Deleted

## 2019-06-10 ENCOUNTER — Encounter (INDEPENDENT_AMBULATORY_CARE_PROVIDER_SITE_OTHER): Payer: Self-pay

## 2019-06-10 NOTE — Telephone Encounter (Signed)
Attempted to contact pt due to MyChart Companion responses 06/10/2019 of worsen cough, and new diarrhea; left message on voicemail with callback number [phone].

## 2019-06-10 NOTE — Telephone Encounter (Signed)
3rd attempt to contact pt; left message for pt to contact her PCP for recommendations.

## 2019-06-13 ENCOUNTER — Other Ambulatory Visit: Payer: Self-pay

## 2019-06-13 ENCOUNTER — Ambulatory Visit (INDEPENDENT_AMBULATORY_CARE_PROVIDER_SITE_OTHER): Payer: Self-pay | Admitting: Family Medicine

## 2019-06-13 DIAGNOSIS — J4540 Moderate persistent asthma, uncomplicated: Secondary | ICD-10-CM

## 2019-06-13 MED ORDER — QVAR REDIHALER 40 MCG/ACT IN AERB: 2.0000 | INHALATION_SPRAY | Freq: Two times a day (BID) | RESPIRATORY_TRACT | 1 refills | Status: DC

## 2019-06-13 MED ORDER — PREDNISONE 20 MG PO TABS: 40.0000 mg | ORAL_TABLET | Freq: Every day | ORAL | 0 refills | Status: AC

## 2019-06-13 NOTE — Patient Instructions (Signed)
Asthma, Adult ° °Asthma is a long-term (chronic) condition in which the airways get tight and narrow. The airways are the breathing passages that lead from the nose and mouth down into the lungs. A person with asthma will have times when symptoms get worse. These are called asthma attacks. They can cause coughing, whistling sounds when you breathe (wheezing), shortness of breath, and chest pain. They can make it hard to breathe. There is no cure for asthma, but medicines and lifestyle changes can help control it. °There are many things that can bring on an asthma attack or make asthma symptoms worse (triggers). Common triggers include: °· Mold. °· Dust. °· Cigarette smoke. °· Cockroaches. °· Things that can cause allergy symptoms (allergens). These include animal skin flakes (dander) and pollen from trees or grass. °· Things that pollute the air. These may include household cleaners, wood smoke, smog, or chemical odors. °· Cold air, weather changes, and wind. °· Crying or laughing hard. °· Stress. °· Certain medicines or drugs. °· Certain foods such as dried fruit, potato chips, and grape juice. °· Infections, such as a cold or the flu. °· Certain medical conditions or diseases. °· Exercise or tiring activities. °Asthma may be treated with medicines and by staying away from the things that cause asthma attacks. Types of medicines may include: °· Controller medicines. These help prevent asthma symptoms. They are usually taken every day. °· Fast-acting reliever or rescue medicines. These quickly relieve asthma symptoms. They are used as needed and provide short-term relief. °· Allergy medicines if your attacks are brought on by allergens. °· Medicines to help control the body's defense (immune) system. °Follow these instructions at home: °Avoiding triggers in your home °· Change your heating and air conditioning filter often. °· Limit your use of fireplaces and wood stoves. °· Get rid of pests (such as roaches and  mice) and their droppings. °· Throw away plants if you see mold on them. °· Clean your floors. Dust regularly. Use cleaning products that do not smell. °· Have someone vacuum when you are not home. Use a vacuum cleaner with a HEPA filter if possible. °· Replace carpet with wood, tile, or vinyl flooring. Carpet can trap animal skin flakes and dust. °· Use allergy-proof pillows, mattress covers, and box spring covers. °· Wash bed sheets and blankets every week in hot water. Dry them in a dryer. °· Keep your bedroom free of any triggers. °· Avoid pets and keep windows closed when things that cause allergy symptoms are in the air. °· Use blankets that are made of polyester or cotton. °· Clean bathrooms and kitchens with bleach. If possible, have someone repaint the walls in these rooms with mold-resistant paint. Keep out of the rooms that are being cleaned and painted. °· Wash your hands often with soap and water. If soap and water are not available, use hand sanitizer. °· Do not allow anyone to smoke in your home. °General instructions °· Take over-the-counter and prescription medicines only as told by your doctor. °? Talk with your doctor if you have questions about how or when to take your medicines. °? Make note if you need to use your medicines more often than usual. °· Do not use any products that contain nicotine or tobacco, such as cigarettes and e-cigarettes. If you need help quitting, ask your doctor. °· Stay away from secondhand smoke. °· Avoid doing things outdoors when allergen counts are high and when air quality is low. °· Wear a ski mask   when doing outdoor activities in the winter. The mask should cover your nose and mouth. Exercise indoors on cold days if you can. °· Warm up before you exercise. Take time to cool down after exercise. °· Use a peak flow meter as told by your doctor. A peak flow meter is a tool that measures how well the lungs are working. °· Keep track of the peak flow meter's readings.  Write them down. °· Follow your asthma action plan. This is a written plan for taking care of your asthma and treating your attacks. °· Make sure you get all the shots (vaccines) that your doctor recommends. Ask your doctor about a flu shot and a pneumonia shot. °· Keep all follow-up visits as told by your doctor. This is important. °Contact a doctor if: °· You have wheezing, shortness of breath, or a cough even while taking medicine to prevent attacks. °· The mucus you cough up (sputum) is thicker than usual. °· The mucus you cough up changes from clear or white to yellow, green, gray, or bloody. °· You have problems from the medicine you are taking, such as: °? A rash. °? Itching. °? Swelling. °? Trouble breathing. °· You need reliever medicines more than 2-3 times a week. °· Your peak flow reading is still at 50-79% of your personal best after following the action plan for 1 hour. °· You have a fever. °Get help right away if: °· You seem to be worse and are not responding to medicine during an asthma attack. °· You are short of breath even at rest. °· You get short of breath when doing very little activity. °· You have trouble eating, drinking, or talking. °· You have chest pain or tightness. °· You have a fast heartbeat. °· Your lips or fingernails start to turn blue. °· You are light-headed or dizzy, or you faint. °· Your peak flow is less than 50% of your personal best. °· You feel too tired to breathe normally. °Summary °· Asthma is a long-term (chronic) condition in which the airways get tight and narrow. An asthma attack can make it hard to breathe. °· Asthma cannot be cured, but medicines and lifestyle changes can help control it. °· Make sure you understand how to avoid triggers and how and when to use your medicines. °This information is not intended to replace advice given to you by your health care provider. Make sure you discuss any questions you have with your health care provider. °Document  Released: 04/17/2008 Document Revised: 01/02/2019 Document Reviewed: 12/04/2016 °Elsevier Patient Education © 2020 Elsevier Inc. ° °

## 2019-06-13 NOTE — Assessment & Plan Note (Signed)
Patient presenting with likely asthma exacerbation. Viral symptoms she initially had have resolved. No wheezing on exam but patient took albuterol 30 min prior to exam. Will give 5 day course for PO prednisone as well as advise to use controller inhaler (QVAR). Strict return precautions given. Follow up in 1 week to ensure resolution of symptoms. Advised to go to ED with worsening SOB. O2 sat currently 100% which is reassuring and patient is well appearing.

## 2019-06-13 NOTE — Progress Notes (Signed)
   Subjective:    Patient ID: Jill Reynolds, female    DOB: [birth year], 23 y.o.   MRN: [id number]   CC: asthma  HPI: Asthma  Patient presenting for concerns of SOB which she thinks is related to asthma. Patient has been out of work since 7/18 due to SOB and nausea. States she had a virtual visit and had COVID testing which was negative (test on 7/20). States that her symptoms of nausea and vomiting have resolved but still having SOB which feels like her asthma. States she has increased her inhaler use this week to 4-5 times/day (last use 30 min prior to appointment). States she is taking zyrtec daily. Symptoms worse at night. No night time awakenings. Does have increased wheezing but not significant cough. Denies loss of appetite. Denies fever.    Objective:  BP 120/70   Pulse 84   SpO2 100%  Vitals and nursing note reviewed  General: well nourished, in no acute distress HEENT: normocephalic, TM's visualized bilaterally, PERRL, no scleral icterus or conjunctival pallor, no nasal discharge, moist mucous membranes, good dentition without erythema or discharge noted in posterior oropharynx Neck: supple, non-tender, without lymphadenopathy Cardiac: RRR, clear S1 and S2, no murmurs, rubs, or gallops Respiratory: clear to auscultation bilaterally, no increased work of breathing, speaking full sentences, no accessory muscle use Abdomen: soft, nontender, nondistended, no masses or organomegaly. Bowel sounds present Extremities: no edema or cyanosis. Warm, well perfused.  Skin: warm and dry, no rashes noted Neuro: alert and oriented, no focal deficits   Assessment & Plan:    Asthma, moderate persistent Patient presenting with likely asthma exacerbation. Viral symptoms she initially had have resolved. No wheezing on exam but patient took albuterol 30 min prior to exam. Will give 5 day course for PO prednisone as well as advise to use controller inhaler (QVAR). Strict return precautions given.  Follow up in 1 week to ensure resolution of symptoms. Advised to go to ED with worsening SOB. O2 sat currently 100% which is reassuring and patient is well appearing.     Return in about 2 weeks (around 06/27/2019).   Caroline More, DO, PGY-3

## 2019-09-25 ENCOUNTER — Encounter: Payer: Self-pay | Admitting: Family Medicine

## 2019-10-16 ENCOUNTER — Other Ambulatory Visit: Payer: Self-pay

## 2019-10-16 DIAGNOSIS — Z20828 Contact with and (suspected) exposure to other viral communicable diseases: Secondary | ICD-10-CM | POA: Diagnosis not present

## 2019-10-16 DIAGNOSIS — Z20822 Contact with and (suspected) exposure to covid-19: Secondary | ICD-10-CM

## 2019-10-17 ENCOUNTER — Other Ambulatory Visit: Payer: Self-pay | Admitting: Family Medicine

## 2019-10-17 DIAGNOSIS — J4520 Mild intermittent asthma, uncomplicated: Secondary | ICD-10-CM

## 2019-10-17 DIAGNOSIS — Z9109 Other allergy status, other than to drugs and biological substances: Secondary | ICD-10-CM

## 2019-10-17 MED ORDER — ALBUTEROL SULFATE HFA 108 (90 BASE) MCG/ACT IN AERS: 2.0000 | INHALATION_SPRAY | RESPIRATORY_TRACT | 1 refills | Status: DC | PRN

## 2019-10-17 MED ORDER — CETIRIZINE HCL 10 MG PO TABS: 10.0000 mg | ORAL_TABLET | Freq: Every day | ORAL | 1 refills | Status: DC

## 2019-10-17 NOTE — Telephone Encounter (Signed)
Patient needs a refill on her Albuterol inhaler and her Ceterizine allergy pills.

## 2019-10-17 NOTE — Telephone Encounter (Signed)
Will refill. Please ask patient to schedule appointment with me for a physical & asthma follow up. Thanks Leeanne Rio, MD

## 2019-10-17 NOTE — Telephone Encounter (Signed)
Called patient and LVM to call office and schedule an appointment.   Jill Reynolds, [place]

## 2019-10-19 LAB — NOVEL CORONAVIRUS, NAA: SARS-CoV-2, NAA: NOT DETECTED

## 2019-12-20 ENCOUNTER — Other Ambulatory Visit: Payer: Self-pay | Admitting: Family Medicine

## 2019-12-20 DIAGNOSIS — Z9109 Other allergy status, other than to drugs and biological substances: Secondary | ICD-10-CM

## 2020-02-05 ENCOUNTER — Ambulatory Visit: Payer: Self-pay | Admitting: Family Medicine

## 2020-02-06 ENCOUNTER — Encounter: Payer: Self-pay | Admitting: Family Medicine

## 2020-02-06 ENCOUNTER — Ambulatory Visit: Payer: Medicaid Other | Admitting: Family Medicine

## 2020-02-06 ENCOUNTER — Other Ambulatory Visit: Payer: Self-pay

## 2020-02-06 VITALS — BP 112/60 | HR 97 | Wt 214.5 lb

## 2020-02-06 DIAGNOSIS — R739 Hyperglycemia, unspecified: Secondary | ICD-10-CM | POA: Diagnosis not present

## 2020-02-06 LAB — POCT GLYCOSYLATED HEMOGLOBIN (HGB A1C): Hemoglobin A1C: 5.4 % (ref 4.0–5.6)

## 2020-02-06 NOTE — Progress Notes (Signed)
   HPI 24 year old female who presents for hemoglobin A1c check.  Patient has some lab work done at a bariatric clinic which showed a mildly elevated random glucose.  She is unsure of how high it was.  She was advised to follow-up with her primary care physician for A1c check for better characterization.  She has no other issues.  Looks like around 8 years ago the patient did have some elevated glucose readings in her pregnancy.   CC: Hyperglycemia follow-up   ROS:   Review of Systems See HPI for ROS.   CC, SH/smoking status, and VS noted  Objective: BP 112/60   Pulse 97   Wt 214 lb 8 oz (97.3 kg)   LMP 02/02/2020   SpO2 99%   BMI 39.23 kg/m  Gen: 24 year old African-American female, no acute distress, resting comfortably CV: Skin warm and dry Resp: No accessory muscle use, no respiratory distress Neuro: Alert and oriented, Speech clear, No gross deficits   Assessment and plan:  Hyperglycemia Hyperglycemia noted incidentally on labs from her bariatric clinic.  Performed A1c which was 5.4, within normal range.  Can follow-up for this issue as needed.   Orders Placed This Encounter  Procedures  . POCT glycosylated hemoglobin (Hb A1C)    Associate with Z13.1    No orders of the defined types were placed in this encounter.    Myrene Buddy MD PGY-3 Family Medicine Resident  02/06/2020 9:55 AM

## 2020-02-06 NOTE — Patient Instructions (Signed)
It was great meeting you today!  Because you have a high blood sugar on your other labs we will get a hemoglobin A1c to better evaluate how your sugars are doing for the last 3 months.  I will give a call when this comes back later on this afternoon.

## 2020-02-06 NOTE — Assessment & Plan Note (Signed)
Hyperglycemia noted incidentally on labs from her bariatric clinic.  Performed A1c which was 5.4, within normal range.  Can follow-up for this issue as needed.

## 2020-02-11 DIAGNOSIS — H52223 Regular astigmatism, bilateral: Secondary | ICD-10-CM | POA: Diagnosis not present

## 2020-02-13 DIAGNOSIS — Z3202 Encounter for pregnancy test, result negative: Secondary | ICD-10-CM | POA: Diagnosis not present

## 2020-02-13 DIAGNOSIS — Z01419 Encounter for gynecological examination (general) (routine) without abnormal findings: Secondary | ICD-10-CM | POA: Diagnosis not present

## 2020-02-13 DIAGNOSIS — Z113 Encounter for screening for infections with a predominantly sexual mode of transmission: Secondary | ICD-10-CM | POA: Diagnosis not present

## 2020-02-13 DIAGNOSIS — Z114 Encounter for screening for human immunodeficiency virus [HIV]: Secondary | ICD-10-CM | POA: Diagnosis not present

## 2020-02-13 DIAGNOSIS — Z0000 Encounter for general adult medical examination without abnormal findings: Secondary | ICD-10-CM | POA: Diagnosis not present

## 2020-03-02 ENCOUNTER — Other Ambulatory Visit: Payer: Self-pay | Admitting: Family Medicine

## 2020-03-02 DIAGNOSIS — Z9109 Other allergy status, other than to drugs and biological substances: Secondary | ICD-10-CM

## 2020-05-13 DIAGNOSIS — Z419 Encounter for procedure for purposes other than remedying health state, unspecified: Secondary | ICD-10-CM | POA: Diagnosis not present

## 2020-05-15 ENCOUNTER — Other Ambulatory Visit: Payer: Self-pay | Admitting: Family Medicine

## 2020-05-15 DIAGNOSIS — Z9109 Other allergy status, other than to drugs and biological substances: Secondary | ICD-10-CM

## 2020-05-25 ENCOUNTER — Other Ambulatory Visit: Payer: Self-pay

## 2020-05-26 MED ORDER — EPINEPHRINE 0.3 MG/0.3ML IJ SOAJ: 0.3000 mg | INTRAMUSCULAR | 0 refills | Status: DC

## 2020-05-26 MED ORDER — TRIAMCINOLONE ACETONIDE 0.1 % EX CREA: 1.0000 "application " | TOPICAL_CREAM | Freq: Two times a day (BID) | CUTANEOUS | 0 refills | Status: DC

## 2020-05-26 NOTE — Telephone Encounter (Signed)
Called patient and attempted to make an appointment for eczema and RX.  PCP not available until August.  Patient needs to come in sooner.  Patient will check her calendar and take a picture of her eczema breakout if she has to come in after it has gone.  Problem is that each time she comes in for an appointment, the eczema has subsided.  Patient has to use triamcinolone cream from her sister's RX.  Nothing else works.  Glennie Hawk, CMA

## 2020-05-26 NOTE — Telephone Encounter (Signed)
Please ask patient to schedule a follow up visit with me.  Thanks! Omere Marti J Elias Dennington, MD  

## 2020-06-13 DIAGNOSIS — Z419 Encounter for procedure for purposes other than remedying health state, unspecified: Secondary | ICD-10-CM | POA: Diagnosis not present

## 2020-07-14 DIAGNOSIS — Z419 Encounter for procedure for purposes other than remedying health state, unspecified: Secondary | ICD-10-CM | POA: Diagnosis not present

## 2020-08-13 DIAGNOSIS — Z419 Encounter for procedure for purposes other than remedying health state, unspecified: Secondary | ICD-10-CM | POA: Diagnosis not present

## 2020-09-13 DIAGNOSIS — Z419 Encounter for procedure for purposes other than remedying health state, unspecified: Secondary | ICD-10-CM | POA: Diagnosis not present

## 2020-10-13 DIAGNOSIS — Z419 Encounter for procedure for purposes other than remedying health state, unspecified: Secondary | ICD-10-CM | POA: Diagnosis not present

## 2020-10-22 ENCOUNTER — Encounter: Payer: Self-pay | Admitting: Family Medicine

## 2020-11-02 ENCOUNTER — Other Ambulatory Visit: Payer: Self-pay

## 2020-11-02 ENCOUNTER — Encounter: Payer: Self-pay | Admitting: Student in an Organized Health Care Education/Training Program

## 2020-11-02 ENCOUNTER — Ambulatory Visit (INDEPENDENT_AMBULATORY_CARE_PROVIDER_SITE_OTHER): Payer: Medicaid Other | Admitting: Student in an Organized Health Care Education/Training Program

## 2020-11-02 VITALS — BP 115/80 | HR 82 | Ht 62.0 in | Wt 207.0 lb

## 2020-11-02 DIAGNOSIS — D508 Other iron deficiency anemias: Secondary | ICD-10-CM | POA: Diagnosis not present

## 2020-11-02 DIAGNOSIS — Z1159 Encounter for screening for other viral diseases: Secondary | ICD-10-CM

## 2020-11-02 DIAGNOSIS — Z9109 Other allergy status, other than to drugs and biological substances: Secondary | ICD-10-CM | POA: Diagnosis not present

## 2020-11-02 DIAGNOSIS — R739 Hyperglycemia, unspecified: Secondary | ICD-10-CM

## 2020-11-02 LAB — POCT GLYCOSYLATED HEMOGLOBIN (HGB A1C): Hemoglobin A1C: 4.9 % (ref 4.0–5.6)

## 2020-11-02 MED ORDER — CETIRIZINE HCL 10 MG PO TABS: ORAL_TABLET | ORAL | 1 refills | Status: DC

## 2020-11-02 MED ORDER — TRIAMCINOLONE ACETONIDE 0.1 % EX CREA: 1.0000 "application " | TOPICAL_CREAM | Freq: Two times a day (BID) | CUTANEOUS | 1 refills | Status: DC

## 2020-11-02 NOTE — Progress Notes (Signed)
   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 Avoidance of these foods improves her symptoms.  - requesting hgb A1c screening due to recommendation from bariatric center  She is otherwise doing well.  Review of systems form notable for nothing.  [facility]. H/o anemia and allergies.  Suffers from acute grief from passing of family member recently but says she is getting along well presently and declines any counseling or medication assistance at this time.  Dances for exercise regularly. Would like to lose more weight. Denies tobacco, drug or alcohol use.  Feels safe in her relationship.   OBJECTIVE:   BP 115/80   Pulse 82   Ht 5\' 2"  (1.575 m)   Wt 207 lb (93.9 kg)   LMP 10/20/2020 (Approximate)   SpO2 97%   BMI 37.86 kg/m   General: NAD, pleasant, able to participate in exam Cardiac: RRR, normal heart sounds, no murmurs. 2+ radial and PT pulses bilaterally Heent: negative cervical lymphadenopathy or thyromegaly. Respiratory: CTAB, normal effort, No wheezes, rales or rhonchi Abdomen: soft, nontender, nondistended, no hepatic or splenomegaly, +BS Extremities: no edema. WWP. Skin: warm and dry, no rashes noted Neuro: alert and oriented, no focal deficits Psych: Normal affect and mood  ASSESSMENT/PLAN:   No problem-specific Assessment & Plan notes found for this encounter.   Annual Examination  See AVS for age appropriate recommendations.   PHQ score negative, reviewed and discussed. Blood pressure reviewed and at goal.  Asked about intimate partner violence and patient reports none.   Considered the following items based upon USPSTF recommendations: Hepatitis C: ordered Cervical cancer screening: endorses [facility] Immunizations declines Tdap  and flu and covid today   Follow up in 1 year or sooner if indicated.    14/06/2020, DO [facility]

## 2020-11-02 NOTE — Patient Instructions (Signed)
It was a pleasure to see you today!  To summarize our discussion for this visit:  Your physical looked good today. I saw some signs that could be due to anemia so we will check that as well as your A1c and hep C screening.  Please let us know if you find out which TB screening you need and if you have a form we need to complete for your school.   I would recommend getting your Tdap, flu and COVID vaccines.   If you get a chance, please have your pap results forwarded to our office so we can keep your records complete and up to date.  Some additional health maintenance measures we should update are: Health Maintenance Due  Topic Date Due  . Hepatitis C Screening  Never done  . COVID-19 Vaccine (1) Never done  . TETANUS/TDAP  Never done  . PAP-Cervical Cytology Screening  Never done  . PAP SMEAR-Modifier  Never done  . INFLUENZA VACCINE  Never done  .    Call the clinic at [phone] if your symptoms worsen or you have any concerns.   Thank you for allowing me to take part in your care,  Dr. Jamelle Rushing   Exercising to Lose Weight Exercise is structured, repetitive physical activity to improve fitness and health. Getting regular exercise is important for everyone. It is especially important if you are overweight. Being overweight increases your risk of heart disease, stroke, diabetes, high blood pressure, and several types of cancer. Reducing your calorie intake and exercising can help you lose weight. Exercise is usually categorized as moderate or vigorous intensity. To lose weight, most people need to do a certain amount of moderate-intensity or vigorous-intensity exercise each week. Moderate-intensity exercise  Moderate-intensity exercise is any activity that gets you moving enough to burn at least three times more energy (calories) than if you were sitting. Examples of moderate exercise include:  Walking a mile in 15 minutes.  Doing light yard work.  Biking at an  easy pace. Most people should get at least 150 minutes (2 hours and 30 minutes) a week of moderate-intensity exercise to maintain their body weight. Vigorous-intensity exercise Vigorous-intensity exercise is any activity that gets you moving enough to burn at least six times more calories than if you were sitting. When you exercise at this intensity, you should be working hard enough that you are not able to carry on a conversation. Examples of vigorous exercise include:  Running.  Playing a team sport, such as football, basketball, and soccer.  Jumping rope. Most people should get at least 75 minutes (1 hour and 15 minutes) a week of vigorous-intensity exercise to maintain their body weight. How can exercise affect me? When you exercise enough to burn more calories than you eat, you lose weight. Exercise also reduces body fat and builds muscle. The more muscle you have, the more calories you burn. Exercise also:  Improves mood.  Reduces stress and tension.  Improves your overall fitness, flexibility, and endurance.  Increases bone strength. The amount of exercise you need to lose weight depends on:  Your age.  The type of exercise.  Any health conditions you have.  Your overall physical ability. Talk to your health care provider about how much exercise you need and what types of activities are safe for you. What actions can I take to lose weight? Nutrition   Make changes to your diet as told by your health care provider or diet and nutrition specialist (dietitian).  This may include: ? Eating fewer calories. ? Eating more protein. ? Eating less unhealthy fats. ? Eating a diet that includes fresh fruits and vegetables, whole grains, low-fat dairy products, and lean protein. ? Avoiding foods with added fat, salt, and sugar.  Drink plenty of water while you exercise to prevent dehydration or heat stroke. Activity  Choose an activity that you enjoy and set realistic goals.  Your health care provider can help you make an exercise plan that works for you.  Exercise at a moderate or vigorous intensity most days of the week. ? The intensity of exercise may vary from person to person. You can tell how intense a workout is for you by paying attention to your breathing and heartbeat. Most people will notice their breathing and heartbeat get faster with more intense exercise.  Do resistance training twice each week, such as: ? Push-ups. ? Sit-ups. ? Lifting weights. ? Using resistance bands.  Getting short amounts of exercise can be just as helpful as long structured periods of exercise. If you have trouble finding time to exercise, try to include exercise in your daily routine. ? Get up, stretch, and walk around every 30 minutes throughout the day. ? Go for a walk during your lunch break. ? Park your car farther away from your destination. ? If you take public transportation, get off one stop early and walk the rest of the way. ? Make phone calls while standing up and walking around. ? Take the stairs instead of elevators or escalators.  Wear comfortable clothes and shoes with good support.  Do not exercise so much that you hurt yourself, feel dizzy, or get very short of breath. Where to find more information  [place]. Department of Health and Human Services: ThisPath.fi  Centers for Disease Control and Prevention (CDC): FootballExhibition.com.br Contact a health care provider:  Before starting a new exercise program.  If you have questions or concerns about your weight.  If you have a medical problem that keeps you from exercising. Get help right away if you have any of the following while exercising:  Injury.  Dizziness.  Difficulty breathing or shortness of breath that does not go away when you stop exercising.  Chest pain.  Rapid heartbeat. Summary  Being overweight increases your risk of heart disease, stroke, diabetes, high blood pressure, and several types  of cancer.  Losing weight happens when you burn more calories than you eat.  Reducing the amount of calories you eat in addition to getting regular moderate or vigorous exercise each week helps you lose weight. This information is not intended to replace advice given to you by your health care provider. Make sure you discuss any questions you have with your health care provider. Document Revised: 11/12/2017 Document Reviewed: 11/12/2017 Elsevier Patient Education  2020 [employer].

## 2020-11-03 LAB — CBC
Hematocrit: 39.2 % (ref 34.0–46.6)
Hemoglobin: 12.4 g/dL (ref 11.1–15.9)
MCH: 22.9 pg — ABNORMAL LOW (ref 26.6–33.0)
MCHC: 31.6 g/dL (ref 31.5–35.7)
MCV: 73 fL — ABNORMAL LOW (ref 79–97)
Platelets: 319 10*3/uL (ref 150–450)
RBC: 5.41 x10E6/uL — ABNORMAL HIGH (ref 3.77–5.28)
RDW: 19.2 % — ABNORMAL HIGH (ref 11.7–15.4)
WBC: 6.2 10*3/uL (ref 3.4–10.8)

## 2020-11-03 LAB — HEPATITIS C ANTIBODY: Hep C Virus Ab: 0.2 s/co ratio (ref 0.0–0.9)

## 2020-11-13 DIAGNOSIS — Z419 Encounter for procedure for purposes other than remedying health state, unspecified: Secondary | ICD-10-CM | POA: Diagnosis not present

## 2020-11-22 DIAGNOSIS — Z23 Encounter for immunization: Secondary | ICD-10-CM | POA: Diagnosis not present

## 2020-12-08 ENCOUNTER — Other Ambulatory Visit: Payer: Self-pay | Admitting: Family Medicine

## 2020-12-08 DIAGNOSIS — Z9109 Other allergy status, other than to drugs and biological substances: Secondary | ICD-10-CM

## 2020-12-10 ENCOUNTER — Telehealth: Payer: Self-pay

## 2020-12-10 DIAGNOSIS — Z9109 Other allergy status, other than to drugs and biological substances: Secondary | ICD-10-CM

## 2020-12-10 MED ORDER — CETIRIZINE HCL 10 MG PO TABS: ORAL_TABLET | ORAL | 3 refills | Status: DC

## 2020-12-10 NOTE — Telephone Encounter (Signed)
Spoke with patient. She had pap at [facility] in Aug of 2020. She will contact them and ask them to send [place] the result.  Advised she get COVID vaccine, she declines at this time. Offered to answer questions if she has them in the future. 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  Also encouraged Tdap and offered appointment for this, but she wants to hold off.  Will refill zyrtec (works well for her allergies)  Latrelle Dodrill, MD

## 2020-12-10 NOTE — Telephone Encounter (Signed)
Pharmacy calls nurse line stating they accidentally deleted out patients Cetirizine prescription. Pharmacy asks we resend this. Medication has been resent to [facility].

## 2020-12-10 NOTE — Telephone Encounter (Signed)
Called patient and LVM asking her to call back Want to discuss with her refill request and also get release form to get pap smear results  Latrelle Dodrill, MD

## 2020-12-13 ENCOUNTER — Other Ambulatory Visit: Payer: Self-pay | Admitting: Family Medicine

## 2020-12-13 DIAGNOSIS — J4520 Mild intermittent asthma, uncomplicated: Secondary | ICD-10-CM

## 2020-12-14 DIAGNOSIS — Z419 Encounter for procedure for purposes other than remedying health state, unspecified: Secondary | ICD-10-CM | POA: Diagnosis not present

## 2021-01-11 DIAGNOSIS — Z419 Encounter for procedure for purposes other than remedying health state, unspecified: Secondary | ICD-10-CM | POA: Diagnosis not present

## 2021-02-11 DIAGNOSIS — Z419 Encounter for procedure for purposes other than remedying health state, unspecified: Secondary | ICD-10-CM | POA: Diagnosis not present

## 2021-02-24 ENCOUNTER — Other Ambulatory Visit: Payer: Self-pay

## 2021-02-24 ENCOUNTER — Encounter: Payer: Self-pay | Admitting: Family Medicine

## 2021-02-24 ENCOUNTER — Ambulatory Visit (INDEPENDENT_AMBULATORY_CARE_PROVIDER_SITE_OTHER): Payer: Medicaid Other | Admitting: Family Medicine

## 2021-02-24 VITALS — BP 108/66 | HR 82 | Wt 208.0 lb

## 2021-02-24 DIAGNOSIS — Z889 Allergy status to unspecified drugs, medicaments and biological substances status: Secondary | ICD-10-CM | POA: Diagnosis not present

## 2021-02-24 DIAGNOSIS — Z789 Other specified health status: Secondary | ICD-10-CM | POA: Diagnosis not present

## 2021-02-24 DIAGNOSIS — L709 Acne, unspecified: Secondary | ICD-10-CM

## 2021-02-24 DIAGNOSIS — N915 Oligomenorrhea, unspecified: Secondary | ICD-10-CM | POA: Diagnosis not present

## 2021-02-24 NOTE — Progress Notes (Signed)
    SUBJECTIVE:   CHIEF COMPLAINT / HPI:   Concern for PCOS Patient states that she was recommended by her aesthetician to be evaluated for PCOS given her persistent acne. She does not see a dermatologist. Patient reports that she typically has periods every month but they only last 1-2 days at a time. They are light. She is not on any birth control. Not currently sexually active. There are some months during the year where she will not have a period, states they typically skip about 2 times a year. Reports averaging about 10 periods a year. She also reports that she has unwanted hair on her chin and neck. She has tried to pluck these and reports that more hair are growing. She has received laser hair removal to her neck.  No other unwanted hair on body. Her sister also has PCOS and she suspects that more of her family members may also have it. Patient does have a daughter, aged 27. Did not have difficulty with conceiving- does reports having hyperemesis through pregnancy. She does not desire to be on birth control- she would like to not take medications if possible.   Allergies Patient reports having previously seen an Allergist. She was receiving allergy shots. She would like referral back there to resume back on allergy shots. She has an epi pen that she was prescribed after having an anaphylactic reaction to Vicodin. She has never had to use it.   PERTINENT  PMH / PSH:  Past Medical History:  Diagnosis Date  . MRSA abscess of multiple sites of buttock 05/21/2013   OBJECTIVE:   BP 108/66   Pulse 82   Wt 208 lb (94.3 kg)   LMP 02/17/2021   SpO2 96%   BMI 38.04 kg/m    General: NAD, pleasant, able to participate in exam Cardiac: RRR, no murmurs.  Respiratory: CTAB, normal effort, No wheezes, rales or rhonchi Abdomen: Bowel sounds present, nontender, nondistended, no hepatosplenomegaly. Skin: warm and dry, no rashes noted, face without evidence of active acne. She does have  hyperpigmentation spots on neck without any obvious hirsutism on face Psych: Normal affect and mood  ASSESSMENT/PLAN:   Hypomenorrhea Patient with hx of hypomenorrhea- having light periods lasting only 1-2 days typically. She is concerned for PCOS and does have some signs of hyperandrogenism including unwanted facial hairs and hx recurrent acne (though not present on examination today). Additionally, her sister has PCOS. I think it is reasonable to check lab work to start. Offered U/S to patient as well today but she declined and would like to start with labs. Next steps will depend on lab results- if suggestive of possible PCOS she will need U/S. Also should talk with her about oral contraceptives for endometrial protection if she does have PCOS.  -Beta-hCG -TSH -FSH/LH -Prolactin -Total testosterone   History of allergy Patient has a reported anaphylactic reaction to Vicoden for which she was prescribed an epi pen. She also reports previously seeing an allergist and receiving allergy shots. She is unable to tell me exactly what she was allergic to, however. She thinks she may have a slight reaction to peanuts amongst other things. She requests referral back to an allergist to resume allergy shots.      Sabino Dick, DO [facility]

## 2021-02-24 NOTE — Assessment & Plan Note (Signed)
Patient has a reported anaphylactic reaction to Vicoden for which she was prescribed an epi pen. She also reports previously seeing an allergist and receiving allergy shots. She is unable to tell me exactly what she was allergic to, however. She thinks she may have a slight reaction to peanuts amongst other things. She requests referral back to an allergist to resume allergy shots.

## 2021-02-24 NOTE — Assessment & Plan Note (Signed)
Patient with hx of hypomenorrhea- having light periods lasting only 1-2 days typically. She is concerned for PCOS and does have some signs of hyperandrogenism including unwanted facial hairs and hx recurrent acne (though not present on examination today). Additionally, her sister has PCOS. I think it is reasonable to check lab work to start. Offered U/S to patient as well today but she declined and would like to start with labs. Next steps will depend on lab results- if suggestive of possible PCOS she will need U/S. Also should talk with her about oral contraceptives for endometrial protection if she does have PCOS.  -Beta-hCG -TSH -FSH/LH -Prolactin -Total testosterone

## 2021-02-24 NOTE — Patient Instructions (Addendum)
It was wonderful to see you today.  Please bring ALL of your medications with you to every visit.   Today we talked about:  -Doing blood work to assess for signs of PCOS. I will let you know the results of these tests.   -I sent a referral to an Allergist for you to get started back on allergy shots at your request.  Thank you for choosing [facility].   Please call [phone] with any questions about today's appointment.  Please be sure to schedule follow up at the front  desk before you leave today.   Sabino Dick, DO PGY-1 Family Medicine    Polycystic Ovary Syndrome  Polycystic ovarian syndrome (PCOS) is a common hormonal disorder among women of reproductive age. In most women with PCOS, small fluid-filled sacs (cysts) grow on the ovaries. PCOS can cause problems with menstrual periods and make it hard to get and stay pregnant. If this condition is not treated, it can lead to serious health problems, such as diabetes and heart disease. What are the causes? The cause of this condition is not known. It may be due to certain factors, such as:  Irregular menstrual cycle.  High levels of certain hormones.  Problems with the hormone that helps to control blood sugar (insulin).  Certain genes. What increases the risk? You are more likely to develop this condition if you:  Have a family history of PCOS or type 2 diabetes.  Are overweight, eat unhealthy foods, and are not active. These factors may cause problems with blood sugar control, which can contribute to PCOS or PCOS symptoms. What are the signs or symptoms? Symptoms of this condition include:  Ovarian cysts and sometimes pelvic pain.  Menstrual periods that are not regular or are too heavy.  Inability to get or stay pregnant.  Increased growth of hair on the face, chest, stomach, back, thumbs, thighs, or toes.  Acne or oily skin. Acne may develop during adulthood, and it may not get better  with treatment.  Weight gain or obesity.  Patches of thickened and dark brown or black skin on the neck, arms, breasts, or thighs. How is this diagnosed? This condition is diagnosed based on:  Your medical history.  A physical exam that includes a pelvic exam. Your health care provider may look for areas of increased hair growth on your skin.  Tests, such as: ? An ultrasound to check the ovaries for cysts and to view the lining of the uterus. ? Blood tests to check levels of sugar (glucose), female hormone (testosterone), and female hormones (estrogen and progesterone). How is this treated? There is no cure for this condition, but treatment can help to manage symptoms and prevent more health problems from developing. Treatment varies depending on your symptoms and if you want to have a baby or if you need birth control. Treatment may include:  Making nutrition and lifestyle changes.  Taking the progesterone hormone to start a menstrual period.  Taking birth control pills to help you have regular menstrual periods.  Taking medicines such as: ? Medicines to make you ovulate, if you want to get pregnant. ? Medicine to reduce extra hair growth.  Having surgery in severe cases. This may involve making small holes in one or both of your ovaries. This decreases the amount of testosterone that your body makes. Follow these instructions at home:  Take over-the-counter and prescription medicines only as told by your health care provider.  Follow a healthy meal  plan that includes lean proteins, complex carbohydrates, fresh fruits and vegetables, low-fat dairy products, healthy fats, and fiber.  If you are overweight, lose weight as told by your health care provider. Your health care provider can determine how much weight loss is best for you and can help you lose weight safely.  Keep all follow-up visits. This is important. Contact a health care provider if:  Your symptoms do not get  better with medicine.  Your symptoms get worse or you develop new symptoms. Summary  Polycystic ovarian syndrome (PCOS) is a common hormonal disorder among women of reproductive age.  PCOS can cause problems with menstrual periods and make it hard to get and stay pregnant.  If this condition is not treated, it can lead to serious health problems, such as diabetes and heart disease.  There is no cure for this condition, but treatment can help to manage symptoms and prevent more health problems from developing. This information is not intended to replace advice given to you by your health care provider. Make sure you discuss any questions you have with your health care provider. Document Revised: 04/08/2020 Document Reviewed: 04/08/2020 Elsevier Patient Education  2021 [employer].

## 2021-02-25 LAB — TSH: TSH: 2.57 u[IU]/mL (ref 0.450–4.500)

## 2021-02-25 LAB — HCG, SERUM, QUALITATIVE: hCG,Beta Subunit,Qual,Serum: NEGATIVE m[IU]/mL (ref <6)

## 2021-02-25 LAB — FSH/LH
FSH: 6 m[IU]/mL
LH: 8.5 m[IU]/mL

## 2021-02-25 LAB — TESTOSTERONE: Testosterone: 29 ng/dL (ref 13–71)

## 2021-02-25 LAB — PROLACTIN: Prolactin: 16.1 ng/mL (ref 4.8–23.3)

## 2021-03-13 DIAGNOSIS — Z419 Encounter for procedure for purposes other than remedying health state, unspecified: Secondary | ICD-10-CM | POA: Diagnosis not present

## 2021-04-13 DIAGNOSIS — Z419 Encounter for procedure for purposes other than remedying health state, unspecified: Secondary | ICD-10-CM | POA: Diagnosis not present

## 2021-05-10 ENCOUNTER — Ambulatory Visit: Payer: Self-pay | Admitting: Allergy

## 2021-05-13 DIAGNOSIS — Z419 Encounter for procedure for purposes other than remedying health state, unspecified: Secondary | ICD-10-CM | POA: Diagnosis not present

## 2021-05-23 NOTE — Progress Notes (Signed)
New Patient Note  RE: Jill Reynolds MRN: [id number] DOB: February 16, 1996 Date of Office Visit: 05/24/2021  Consult requested by: Moses Manners, MD Primary care provider: Latrelle Dodrill, MD  Chief Complaint: Allergies (Runny nose, scratchy throat, ear itching, hives ), Asthma, and Eczema  History of Present Illness: I had the pleasure of seeing Jill Reynolds for initial evaluation at the [facility] on 05/24/2021. She is a 25 y.o. female, who is referred here by Latrelle Dodrill, MD for the evaluation of allergic rhinitis.  Rhinitis:  She reports symptoms of rhinorrhea, nasal congestion, itchy ears/throat, itchy/watery eyes, breaking out in hives. Symptoms have been going on for many years but worse the past 8 years. The symptoms are present all year around with worsening in spring. Other triggers include exposure to none. Anosmia: no. Headache: no. She has used zyrtec, benadryl, Flonase with some improvement in symptoms. Sinus infections: none. Previous work up includes: skin testing over 10 years ago showed multiple positives and was on allergy injections for about 1-2 years with unknown benefit. Previous ENT evaluation: no. Previous sinus imaging: no. History of nasal polyps: no. Last eye exam: last year. History of reflux: sometimes and not on daily meds.  Asthma:  She reports symptoms of chest tightness, shortness of breath, wheezing for 20+ years. Current medications include albuterol prn which help. She reports not using aerochamber with inhalers. She tried the following inhalers: Qvar. Main triggers are unknown but worse with dust exposure, exercise, being outdoors. In the last month, frequency of symptoms: daily for 1 month. Frequency of nocturnal symptoms: 0x/month. Frequency of SABA use: daily for 1 month. Interference with physical activity: no. Sleep is undisturbed. In the last 12 months, emergency room visits/urgent care visits/doctor office visits or  hospitalizations due to respiratory issues: no. In the last 12 months, oral steroids courses: no. Lifetime history of hospitalization for respiratory issues: 16 years ago. Prior intubations: no. Asthma was diagnosed at age infancy. History of pneumonia: not recently.  She was evaluated by allergist/pulmonologist in the past. Smoking exposure: denies. Up to date with flu vaccine: yes. Up to date with pneumonia vaccine: no. Up to date with COVID-19 vaccine: not completed. Prior Covid-19 infection: no.  02/24/2021 PCP visit: "Allergies Patient reports having previously seen an Allergist. She was receiving allergy shots. She would like referral back there to resume back on allergy shots. She has an epi pen that she was prescribed after having an anaphylactic reaction to Vicodin. She has never had to use it."  Assessment and Plan: Jill Reynolds is a 25 y.o. female with: Not well controlled moderate persistent asthma Diagnosed with asthma over 20 years ago and currently using albuterol on a daily basis for 1 month with good benefit.  Main triggers are dust, exercise and being outdoors. Today's spirometry shows some restriction with 9% improvement in FEV1 post bronchodilator treatment.  Clinically feeling unchanged. Start maintenance inhaler due to daily symptoms with albuterol use. Daily controller medication(s): Start Symbicort 2 puffs twice a day with spacer and rinse mouth afterwards. May use albuterol rescue inhaler 2 puffs every 4 to 6 hours as needed for shortness of breath, chest tightness, coughing, and wheezing. May use albuterol rescue inhaler 2 puffs 5 to 15 minutes prior to strenuous physical activities. Monitor frequency of use.  Get spirometry at next visit.  Other allergic rhinitis Perennial rhinoconjunctivitis symptoms for 8 years with worsening in the spring.  Skin testing many years ago showed multiple positives  and was on allergy immunotherapy for 1 to 2 years with unknown benefit.   Today's skin testing showed: Positive to grass, ragweed, trees, mold, dust mites, cat, dog. Start environmental control measures as below. Use over the counter antihistamines such as Zyrtec (cetirizine), Claritin (loratadine), Allegra (fexofenadine), or Xyzal (levocetirizine) daily as needed. May take twice a day during allergy flares. May switch antihistamines every few months. Use Flonase (fluticasone) nasal spray 1 spray per nostril twice a day as needed for nasal congestion.  Nasal saline spray (i.e., Simply Saline) or nasal saline lavage (i.e., NeilMed) is recommended as needed and prior to medicated nasal sprays. Consider allergy injections for long term control if above medications do not help the symptoms - handout given. Discussed with patient that asthma needs to be in better control before starting.   Allergic conjunctivitis of both eyes See assessment and plan as above.  Other atopic dermatitis Usually on the face. Using triamcinolone prn with good benefit. See below for proper skin care. Use desonide 0.05% ointment twice a day as needed for mild rash flares - okay to use on the face, neck, groin area. Do not use more than 1 week at a time. Discussed that triamcinolone is not recommended to be used on the face.  History of food allergy Patient apparently had positive skin testing to peanuts in the past but she tolerates now with no issues. Today's skin prick testing was negative to peanuts. Will remove peanut from allergy list. Okay to eat as before.  Adverse effect of other drugs, medicaments and biological substances, subsequent encounter Throat swelling, hives, lip swelling, dizziness and was treated epinephrine after Vicodin. Continue to avoid Vicodin.  Return in about 2 months (around 07/25/2021).  Meds ordered this encounter  Medications   budesonide-formoterol (SYMBICORT) 80-4.5 MCG/ACT inhaler    Sig: Inhale 2 puffs into the lungs in the morning and at bedtime. with  spacer and rinse mouth afterwards.    Dispense:  1 each    Refill:  3   Spacer/Aero-Holding Chambers (AEROCHAMBER PLUS) inhaler    Sig: Use as instructed    Dispense:  1 each    Refill:  2   desonide (DESOWEN) 0.05 % ointment    Sig: Apply 1 application topically 2 (two) times daily as needed (rash).    Dispense:  15 g    Refill:  2    Lab Orders  No laboratory test(s) ordered today    Other allergy screening: Food allergy:  last skin testing was positive to peanuts and she has been eating it with no issues. Medication allergy: yes Vicodin - throat swelling, hives, lip swelling, dizziness and was treated epinephrine.  Hymenoptera allergy: no Urticaria: yes Breaks out when there are a lot of pollen or nature.  Eczema:yes Usually breaks out on the face and uses triamcinolone prn with good benefit.  History of recurrent infections suggestive of immunodeficency: no  Diagnostics: Spirometry:  Tracings reviewed. Her effort: Good reproducible efforts. FVC: 2.33L FEV1: 1.85L, 65% predicted FEV1/FVC ratio: 79% Interpretation: Spirometry consistent with possible restrictive disease with 9% improvement in FEV1 post bronchodilator treatment. Clinically feeling unchanged.   Please see scanned spirometry results for details.  Skin Testing: Environmental allergy panel and select foods. Positive to grass, ragweed, trees, mold, dust mites, cat, dog. Negative to common foods. Results discussed with patient/family.  Airborne Adult Perc - 05/24/21 0950     Time Antigen Placed 8101    Allergen Manufacturer Waynette Buttery    Location Back  Number of Test 59    1. Control-Buffer 50% Glycerol Negative    2. Control-Histamine 1 mg/ml 2+    3. Albumin saline Negative    4. Bahia 2+    5. [place] 2+    6. Johnson Negative    7. Kentucky Blue 2+    8. Meadow Fescue 2+    9. Perennial Rye 2+    10. Sweet Vernal --   +/-   11. Timothy 2+    12. Cocklebur Negative    13. Burweed Marshelder  Negative    14. Ragweed, short Negative    15. Ragweed, Giant Negative    16. Plantain,  English Negative    17. Lamb's Quarters Negative    18. Sheep Sorrell Negative    19. Rough Pigweed Negative    20. Marsh Elder, Rough Negative    21. Mugwort, Common Negative    22. Ash mix Negative    23. Birch mix Negative    24. Beech American Negative    25. Box, Elder Negative    26. Cedar, red Negative    27. Cottonwood, Guinea-Bissau Negative    28. Elm mix Negative    29. Hickory 3+    30. Maple mix Negative    31. Oak, Guinea-Bissau mix Negative    32. Pecan Pollen 4+    33. Pine mix --   +/-   34. Sycamore Eastern Negative    35. Walnut, Black Pollen Negative    36. Alternaria alternata 2+    37. Cladosporium Herbarum 2+    38. Aspergillus mix Negative    39. Penicillium mix Negative    40. Bipolaris sorokiniana (Helminthosporium) Negative    41. Drechslera spicifera (Curvularia) Negative    42. Mucor plumbeus Negative    43. Fusarium moniliforme 2+    44. Aureobasidium pullulans (pullulara) 2+    45. Rhizopus oryzae Negative    46. Botrytis cinera Negative    47. Epicoccum nigrum 2+    48. Phoma betae 2+    49. Candida Albicans Negative    50. Trichophyton mentagrophytes --   +/-   51. Mite, D Farinae  5,000 AU/ml 2+    52. Mite, D Pteronyssinus  5,000 AU/ml Negative    53. Cat Hair 10,000 BAU/ml 2+    54.  Dog Epithelia Negative    55. Mixed Feathers Negative    56. Horse Epithelia Negative    57. Cockroach, German Negative    58. Mouse Negative    59. Tobacco Leaf Negative             Food Perc - 05/24/21 0950       Test Information   Time Antigen Placed 5830    Allergen Manufacturer Waynette Buttery    Location Back    Number of allergen test 10      Food   1. Peanut Negative    2. Soybean food Negative    3. Wheat, whole Negative    4. Sesame Negative    5. Milk, cow Negative    6. Egg White, chicken Negative    7. Casein Negative    8. Shellfish mix Negative    9.  Fish mix Negative    10. Cashew Negative             Intradermal - 05/24/21 1026     Time Antigen Placed 1026    Allergen Manufacturer Other    Location Arm    Number of  Test 7    Control Negative    Ragweed mix 2+    Weed mix Negative    Mold 2 2+    Mold 3 2+    Dog 3+    Cockroach Negative             Past Medical History: Patient Active Problem List   Diagnosis Date Noted   Allergic conjunctivitis of both eyes 05/24/2021   History of food allergy 05/24/2021   Adverse effect of other drugs, medicaments and biological substances, subsequent encounter 05/24/2021   Hypomenorrhea 02/24/2021   History of allergy 02/24/2021   Hyperglycemia 02/06/2020   Iron deficiency anemia 10/24/2016   Pica 10/23/2016   Other atopic dermatitis 09/04/2014   Other allergic rhinitis 04/18/2012   Sickle cell trait (HCC) 02/07/2012   Not well controlled moderate persistent asthma 01/10/2007   Past Medical History:  Diagnosis Date   Asthma    Eczema    MRSA abscess of multiple sites of buttock 05/21/2013   Past Surgical History: Past Surgical History:  Procedure Laterality Date   NO PAST SURGERIES     WISDOM TOOTH EXTRACTION     Medication List:  Current Outpatient Medications  Medication Sig Dispense Refill   albuterol (VENTOLIN HFA) 108 (90 Base) MCG/ACT inhaler INHALE 2 PUFFS INTO THE LUNGS EVERY 4 HOURS AS NEEDED 18 g 1   budesonide-formoterol (SYMBICORT) 80-4.5 MCG/ACT inhaler Inhale 2 puffs into the lungs in the morning and at bedtime. with spacer and rinse mouth afterwards. 1 each 3   cetirizine (ZYRTEC) 10 MG tablet Take one tablet by mouth daily. 90 tablet 3   desonide (DESOWEN) 0.05 % ointment Apply 1 application topically 2 (two) times daily as needed (rash). 15 g 2   EPINEPHrine 0.3 mg/0.3 mL IJ SOAJ injection Inject 0.3 mLs (0.3 mg total) into the muscle as directed. 1 each 0   Spacer/Aero-Holding Chambers (AEROCHAMBER PLUS) inhaler Use as instructed 1 each 2    triamcinolone (KENALOG) 0.1 % Apply 1 application topically 2 (two) times daily. 30 g 1   No current facility-administered medications for this visit.   Allergies: Allergies  Allergen Reactions   Vicodin [Hydrocodone-Acetaminophen] Anaphylaxis   Social History: Social History   Socioeconomic History   Marital status: Single    Spouse name: Not on file   Number of children: 1   Years of education: Not on file   Highest education level: Not on file  Occupational History   Not on file  Tobacco Use   Smoking status: Never   Smokeless tobacco: Never  Substance and Sexual Activity   Alcohol use: No   Drug use: No   Sexual activity: Yes    Partners: Male    Birth control/protection: Condom  Other Topics Concern   Not on file  Social History Narrative   Not on file   Social Determinants of Health   Financial Resource Strain: Not on file  Food Insecurity: Not on file  Transportation Needs: Not on file  Physical Activity: Not on file  Stress: Not on file  Social Connections: Not on file   Lives in a 27 year old house. Smoking: denies Occupation: [employer]  Environmental HistorySurveyor, minerals: Water Damage/mildew in the house: no Engineer, civil (consulting)Carpet in the family room: yes Carpet in the bedroom: yes Heating: electric Cooling: central Pet: no  Family History: Family History  Problem Relation Age of Onset   Allergic rhinitis Mother    Heart disease Mother    Anemia Mother  Allergic rhinitis Father    Asthma Father    Hypertension Father    Allergic rhinitis Sister    Asthma Sister    Allergic rhinitis Sister    Asthma Sister    Eczema Daughter    Urticaria Neg Hx    Angioedema Neg Hx    Review of Systems  Constitutional:  Negative for appetite change, chills, fever and unexpected weight change.  HENT:  Positive for congestion and rhinorrhea.   Eyes:  Positive for itching.  Respiratory:  Positive for chest tightness, shortness of breath and wheezing. Negative for cough.    Cardiovascular:  Negative for chest pain.  Gastrointestinal:  Negative for abdominal pain.  Genitourinary:  Negative for difficulty urinating.  Skin:  Negative for rash.  Allergic/Immunologic: Positive for environmental allergies. Negative for food allergies.  Neurological:  Negative for headaches.   Objective: BP 106/68   Pulse 78   Temp (!) 96.4 F (35.8 C) (Temporal)   Resp 20   Ht  (1.626 m)   Wt 216 lb (98 kg)   SpO2 98%   BMI 37.08 kg/m  Body mass index is 37.08 kg/m. Physical Exam Vitals and nursing note reviewed.  Constitutional:      Appearance: Normal appearance. She is well-developed.  HENT:     Head: Normocephalic and atraumatic.     Right Ear: External ear normal. There is impacted cerumen.     Left Ear: External ear normal. There is impacted cerumen.     Nose: Nose normal.     Mouth/Throat:     Mouth: Mucous membranes are moist.     Pharynx: Oropharynx is clear.  Eyes:     Conjunctiva/sclera: Conjunctivae normal.  Cardiovascular:     Rate and Rhythm: Normal rate and regular rhythm.     Heart sounds: Normal heart sounds. No murmur heard.   No friction rub. No gallop.  Pulmonary:     Effort: Pulmonary effort is normal.     Breath sounds: Normal breath sounds. No wheezing, rhonchi or rales.  Musculoskeletal:     Cervical back: Neck supple.  Skin:    General: Skin is warm.     Findings: No rash.  Neurological:     Mental Status: She is alert and oriented to person, place, and time.  Psychiatric:        Behavior: Behavior normal.  The plan was reviewed with the patient/family, and all questions/concerned were addressed.  It was my pleasure to see Jill Reynolds today and participate in her care. Please feel free to contact me with any questions or concerns.  Sincerely,  Wyline Mood, DO Allergy & Immunology  [facility] office: [phone] [facility] office: [phone]

## 2021-05-24 ENCOUNTER — Other Ambulatory Visit: Payer: Self-pay

## 2021-05-24 ENCOUNTER — Telehealth: Payer: Self-pay

## 2021-05-24 ENCOUNTER — Encounter: Payer: Self-pay | Admitting: Allergy

## 2021-05-24 ENCOUNTER — Ambulatory Visit (INDEPENDENT_AMBULATORY_CARE_PROVIDER_SITE_OTHER): Payer: Medicaid Other | Admitting: Allergy

## 2021-05-24 VITALS — BP 106/68 | HR 78 | Temp 96.4°F | Resp 20 | Ht 64.0 in | Wt 216.0 lb

## 2021-05-24 DIAGNOSIS — H1013 Acute atopic conjunctivitis, bilateral: Secondary | ICD-10-CM

## 2021-05-24 DIAGNOSIS — Z91018 Allergy to other foods: Secondary | ICD-10-CM

## 2021-05-24 DIAGNOSIS — L2089 Other atopic dermatitis: Secondary | ICD-10-CM | POA: Diagnosis not present

## 2021-05-24 DIAGNOSIS — J4540 Moderate persistent asthma, uncomplicated: Secondary | ICD-10-CM

## 2021-05-24 DIAGNOSIS — T50995D Adverse effect of other drugs, medicaments and biological substances, subsequent encounter: Secondary | ICD-10-CM

## 2021-05-24 DIAGNOSIS — J3089 Other allergic rhinitis: Secondary | ICD-10-CM

## 2021-05-24 MED ORDER — BUDESONIDE-FORMOTEROL FUMARATE 80-4.5 MCG/ACT IN AERO: 2.0000 | INHALATION_SPRAY | Freq: Two times a day (BID) | RESPIRATORY_TRACT | 3 refills | Status: DC

## 2021-05-24 MED ORDER — DESONIDE 0.05 % EX OINT: 1.0000 "application " | TOPICAL_OINTMENT | Freq: Two times a day (BID) | CUTANEOUS | 2 refills | Status: DC | PRN

## 2021-05-24 MED ORDER — AEROCHAMBER PLUS MISC: 2 refills | Status: DC

## 2021-05-24 NOTE — Telephone Encounter (Signed)
Pa submitted thru cover my meds for desonide waiting on response

## 2021-05-24 NOTE — Assessment & Plan Note (Signed)
Usually on the face. Using triamcinolone prn with good benefit. . See below for proper skin care. . Use desonide 0.05% ointment twice a day as needed for mild rash flares - okay to use on the face, neck, groin area. Do not use more than 1 week at a time. . Discussed that triamcinolone is not recommended to be used on the face.

## 2021-05-24 NOTE — Patient Instructions (Addendum)
Today's skin testing showed: Positive to grass, ragweed, trees, mold, dust mites, cat, dog.  Negative to common foods. I'm going to remove peanut allergy from your allergy list.   Asthma: Daily controller medication(s): Start Symbicort 2 puffs twice a day with spacer and rinse mouth afterwards. May use albuterol rescue inhaler 2 puffs every 4 to 6 hours as needed for shortness of breath, chest tightness, coughing, and wheezing. May use albuterol rescue inhaler 2 puffs 5 to 15 minutes prior to strenuous physical activities. Monitor frequency of use.  Asthma control goals:  Full participation in all desired activities (may need albuterol before activity) Albuterol use two times or less a week on average (not counting use with activity) Cough interfering with sleep two times or less a month Oral steroids no more than once a year No hospitalizations  Environmental allergies Start environmental control measures as below. Use over the counter antihistamines such as Zyrtec (cetirizine), Claritin (loratadine), Allegra (fexofenadine), or Xyzal (levocetirizine) daily as needed. May take twice a day during allergy flares. May switch antihistamines every few months. Use Flonase (fluticasone) nasal spray 1 spray per nostril twice a day as needed for nasal congestion.  Nasal saline spray (i.e., Simply Saline) or nasal saline lavage (i.e., NeilMed) is recommended as needed and prior to medicated nasal sprays. Consider allergy injections for long term control if above medications do not help the symptoms - handout given.   Skin: See below for proper skin care. Use desonide 0.05% ointment twice a day as needed for mild rash flares - okay to use on the face, neck, groin area. Do not use more than 1 week at a time.  Drug allergy: Continue to avoid Vicodin.  Follow up in 2 months or sooner if needed.   Reducing Pollen Exposure Pollen seasons: trees (spring), grass (summer) and ragweed/weeds  (fall). Keep windows closed in your home and car to lower pollen exposure.  Install air conditioning in the bedroom and throughout the house if possible.  Avoid going out in dry windy days - especially early morning. Pollen counts are highest between 5 - 10 AM and on dry, hot and windy days.  Save outside activities for late afternoon or after a heavy rain, when pollen levels are lower.  Avoid mowing of grass if you have grass pollen allergy. Be aware that pollen can also be transported indoors on people and pets.  Dry your clothes in an automatic dryer rather than hanging them outside where they might collect pollen.  Rinse hair and eyes before bedtime. Mold Control Mold and fungi can grow on a variety of surfaces provided certain temperature and moisture conditions exist.  Outdoor molds grow on plants, decaying vegetation and soil. The major outdoor mold, Alternaria and Cladosporium, are found in very high numbers during hot and dry conditions. Generally, a late summer - fall peak is seen for common outdoor fungal spores. Rain will temporarily lower outdoor mold spore count, but counts rise rapidly when the rainy period ends. The most important indoor molds are Aspergillus and Penicillium. Dark, humid and poorly ventilated basements are ideal sites for mold growth. The next most common sites of mold growth are the bathroom and the kitchen. Outdoor (Seasonal) Mold Control Use air conditioning and keep windows closed. Avoid exposure to decaying vegetation. Avoid leaf raking. Avoid grain handling. Consider wearing a face mask if working in moldy areas.  Indoor (Perennial) Mold Control  Maintain humidity below 50%. Get rid of mold growth on hard surfaces with  water, detergent and, if necessary, 5% bleach (do not mix with other cleaners). Then dry the area completely. If mold covers an area more than 10 square feet, consider hiring an indoor environmental professional. For clothing, washing with  soap and water is best. If moldy items cannot be cleaned and dried, throw them away. Remove sources e.g. contaminated carpets. Repair and seal leaking roofs or pipes. Using dehumidifiers in damp basements may be helpful, but empty the water and clean units regularly to prevent mildew from forming. All rooms, especially basements, bathrooms and kitchens, require ventilation and cleaning to deter mold and mildew growth. Avoid carpeting on concrete or damp floors, and storing items in damp areas. Control of House Dust Mite Allergen Dust mite allergens are a common trigger of allergy and asthma symptoms. While they can be found throughout the house, these microscopic creatures thrive in warm, humid environments such as bedding, upholstered furniture and carpeting. Because so much time is spent in the bedroom, it is essential to reduce mite levels there.  Encase pillows, mattresses, and box springs in special allergen-proof fabric covers or airtight, zippered plastic covers.  Bedding should be washed weekly in hot water (130 F) and dried in a hot dryer. Allergen-proof covers are available for comforters and pillows that can't be regularly washed.  Wash the allergy-proof covers every few months. Minimize clutter in the bedroom. Keep pets out of the bedroom.  Keep humidity less than 50% by using a dehumidifier or air conditioning. You can buy a humidity measuring device called a hygrometer to monitor this.  If possible, replace carpets with hardwood, linoleum, or washable area rugs. If that's not possible, vacuum frequently with a vacuum that has a HEPA filter. Remove all upholstered furniture and non-washable window drapes from the bedroom. Remove all non-washable stuffed toys from the bedroom.  Wash stuffed toys weekly. Pet Allergen Avoidance: Contrary to popular opinion, there are no "hypoallergenic" breeds of dogs or cats. That is because people are not allergic to an animal's hair, but to an allergen  found in the animal's saliva, dander (dead skin flakes) or urine. Pet allergy symptoms typically occur within minutes. For some people, symptoms can build up and become most severe 8 to 12 hours after contact with the animal. People with severe allergies can experience reactions in public places if dander has been transported on the pet owners' clothing. Keeping an animal outdoors is only a partial solution, since homes with pets in the yard still have higher concentrations of animal allergens. Before getting a pet, ask your allergist to determine if you are allergic to animals. If your pet is already considered part of your family, try to minimize contact and keep the pet out of the bedroom and other rooms where you spend a great deal of time. As with dust mites, vacuum carpets often or replace carpet with a hardwood floor, tile or linoleum. High-efficiency particulate air (HEPA) cleaners can reduce allergen levels over time. While dander and saliva are the source of cat and dog allergens, urine is the source of allergens from rabbits, hamsters, mice and [place] pigs; so ask a non-allergic family member to clean the animal's cage. If you have a pet allergy, talk to your allergist about the potential for allergy immunotherapy (allergy shots). This strategy can often provide long-term relief.  Skin care recommendations  Bath time: Always use lukewarm water. AVOID very hot or cold water. Keep bathing time to 5-10 minutes. Do NOT use bubble bath. Use a mild soap  and use just enough to wash the dirty areas. Do NOT scrub skin vigorously.  After bathing, pat dry your skin with a towel. Do NOT rub or scrub the skin.  Moisturizers and prescriptions:  ALWAYS apply moisturizers immediately after bathing (within 3 minutes). This helps to lock-in moisture. Use the moisturizer several times a day over the whole body. Good summer moisturizers include: Aveeno, CeraVe, Cetaphil. Good winter moisturizers  include: Aquaphor, Vaseline, Cerave, Cetaphil, Eucerin, Vanicream. When using moisturizers along with medications, the moisturizer should be applied about one hour after applying the medication to prevent diluting effect of the medication or moisturize around where you applied the medications. When not using medications, the moisturizer can be continued twice daily as maintenance.  Laundry and clothing: Avoid laundry products with added color or perfumes. Use unscented hypo-allergenic laundry products such as Tide free, Cheer free & gentle, and All free and clear.  If the skin still seems dry or sensitive, you can try double-rinsing the clothes. Avoid tight or scratchy clothing such as wool. Do not use fabric softeners or dyer sheets.

## 2021-05-24 NOTE — Assessment & Plan Note (Signed)
Perennial rhinoconjunctivitis symptoms for 8 years with worsening in the spring.  Skin testing many years ago showed multiple positives and was on allergy immunotherapy for 1 to 2 years with unknown benefit.   Today's skin testing showed: Positive to grass, ragweed, trees, mold, dust mites, cat, dog.  Start environmental control measures as below.  Use over the counter antihistamines such as Zyrtec (cetirizine), Claritin (loratadine), Allegra (fexofenadine), or Xyzal (levocetirizine) daily as needed. May take twice a day during allergy flares. May switch antihistamines every few months.  Use Flonase (fluticasone) nasal spray 1 spray per nostril twice a day as needed for nasal congestion.   Nasal saline spray (i.e., Simply Saline) or nasal saline lavage (i.e., NeilMed) is recommended as needed and prior to medicated nasal sprays.  Consider allergy injections for long term control if above medications do not help the symptoms - handout given. Discussed with patient that asthma needs to be in better control before starting.

## 2021-05-24 NOTE — Assessment & Plan Note (Signed)
Diagnosed with asthma over 20 years ago and currently using albuterol on a daily basis for 1 month with good benefit.  Main triggers are dust, exercise and being outdoors. Today's spirometry shows some restriction with 9% improvement in FEV1 post bronchodilator treatment.  Clinically feeling unchanged.  Start maintenance inhaler due to daily symptoms with albuterol use. . Daily controller medication(s): Start Symbicort 2 puffs twice a day with spacer and rinse mouth afterwards. . May use albuterol rescue inhaler 2 puffs every 4 to 6 hours as needed for shortness of breath, chest tightness, coughing, and wheezing. May use albuterol rescue inhaler 2 puffs 5 to 15 minutes prior to strenuous physical activities. Monitor frequency of use.  . Get spirometry at next visit.

## 2021-05-24 NOTE — Assessment & Plan Note (Signed)
Patient apparently had positive skin testing to peanuts in the past but she tolerates now with no issues.  Today's skin prick testing was negative to peanuts.  Will remove peanut from allergy list. Okay to eat as before.

## 2021-05-24 NOTE — Assessment & Plan Note (Signed)
.   See assessment and plan as above. 

## 2021-05-24 NOTE — Assessment & Plan Note (Signed)
Throat swelling, hives, lip swelling, dizziness and was treated epinephrine after Vicodin.  Continue to avoid Vicodin.

## 2021-06-13 DIAGNOSIS — Z419 Encounter for procedure for purposes other than remedying health state, unspecified: Secondary | ICD-10-CM | POA: Diagnosis not present

## 2021-07-14 DIAGNOSIS — Z419 Encounter for procedure for purposes other than remedying health state, unspecified: Secondary | ICD-10-CM | POA: Diagnosis not present

## 2021-08-10 ENCOUNTER — Other Ambulatory Visit: Payer: Self-pay

## 2021-08-10 ENCOUNTER — Ambulatory Visit (INDEPENDENT_AMBULATORY_CARE_PROVIDER_SITE_OTHER): Payer: Medicaid Other | Admitting: Allergy

## 2021-08-10 ENCOUNTER — Encounter: Payer: Self-pay | Admitting: Allergy

## 2021-08-10 VITALS — BP 108/68 | HR 86 | Temp 98.1°F | Resp 16 | Ht 62.0 in | Wt 222.2 lb

## 2021-08-10 DIAGNOSIS — H1013 Acute atopic conjunctivitis, bilateral: Secondary | ICD-10-CM

## 2021-08-10 DIAGNOSIS — L2089 Other atopic dermatitis: Secondary | ICD-10-CM

## 2021-08-10 DIAGNOSIS — T50995D Adverse effect of other drugs, medicaments and biological substances, subsequent encounter: Secondary | ICD-10-CM

## 2021-08-10 DIAGNOSIS — J3089 Other allergic rhinitis: Secondary | ICD-10-CM | POA: Diagnosis not present

## 2021-08-10 DIAGNOSIS — J302 Other seasonal allergic rhinitis: Secondary | ICD-10-CM | POA: Diagnosis not present

## 2021-08-10 DIAGNOSIS — J4540 Moderate persistent asthma, uncomplicated: Secondary | ICD-10-CM | POA: Diagnosis not present

## 2021-08-10 DIAGNOSIS — H1010 Acute atopic conjunctivitis, unspecified eye: Secondary | ICD-10-CM | POA: Insufficient documentation

## 2021-08-10 MED ORDER — FLUTICASONE PROPIONATE HFA 110 MCG/ACT IN AERO: 2.0000 | INHALATION_SPRAY | Freq: Two times a day (BID) | RESPIRATORY_TRACT | 3 refills | Status: DC

## 2021-08-10 MED ORDER — AEROCHAMBER PLUS MISC: 2 refills | Status: DC

## 2021-08-10 NOTE — Assessment & Plan Note (Signed)
Past history - Throat swelling, hives, lip swelling, dizziness and was treated epinephrine after Vicodin.  Continue to avoid Vicodin.

## 2021-08-10 NOTE — Assessment & Plan Note (Signed)
Past history - Perennial rhinoconjunctivitis symptoms for 8 years with worsening in the spring.  Skin testing many years ago showed multiple positives and was on allergy immunotherapy for 1 to 2 years with unknown benefit. 2022 skin testing showed: Positive to grass, ragweed, trees, mold, dust mites, cat, dog. Interim history - interested in starting AIT but insurance is changing with new job.   Continue environmental control measures as below.  Use over the counter antihistamines such as Zyrtec (cetirizine), Claritin (loratadine), Allegra (fexofenadine), or Xyzal (levocetirizine) daily as needed.  May switch antihistamines every few months.  Nasal saline spray (i.e., Simply Saline) or nasal saline lavage (i.e., NeilMed) is recommended as needed and prior to medicated nasal sprays. . Start allergy injections - patient will let us know when ready to start once insurance changes. . Had a detailed discussion with patient/family that clinical history is suggestive of allergic rhinitis, and may benefit from allergy immunotherapy (AIT). Discussed in detail regarding the dosing, schedule, side effects (mild to moderate local allergic reaction and rarely systemic allergic reactions including anaphylaxis), and benefits (significant improvement in nasal symptoms, seasonal flares of asthma) of immunotherapy with the patient. There is significant time commitment involved with allergy shots, which includes weekly immunotherapy injections for first 9-12 months and then biweekly to monthly injections for 3-5 years.

## 2021-08-10 NOTE — Patient Instructions (Addendum)
Asthma: Daily controller medication(s): Start Flovent 2 puffs twice a day with spacer and rinse mouth afterwards. Spacer sent in. Stop Symbicort, if you notice worsening symptoms then let us know.  May use albuterol rescue inhaler 2 puffs every 4 to 6 hours as needed for shortness of breath, chest tightness, coughing, and wheezing. May use albuterol rescue inhaler 2 puffs 5 to 15 minutes prior to strenuous physical activities. Monitor frequency of use.  Asthma control goals:  Full participation in all desired activities (may need albuterol before activity) Albuterol use two times or less a week on average (not counting use with activity) Cough interfering with sleep two times or less a month Oral steroids no more than once a year No hospitalizations  Environmental allergies 2022 skin testing showed: Positive to grass, ragweed, trees, mold, dust mites, cat, dog. Continue environmental control measures as below. Use over the counter antihistamines such as Zyrtec (cetirizine), Claritin (loratadine), Allegra (fexofenadine), or Xyzal (levocetirizine) daily as needed.  May switch antihistamines every few months. Nasal saline spray (i.e., Simply Saline) or nasal saline lavage (i.e., NeilMed) is recommended as needed and prior to medicated nasal sprays. Start allergy injections - patient will let us know when ready to start once insurance changes. Had a detailed discussion with patient/family that clinical history is suggestive of allergic rhinitis, and may benefit from allergy immunotherapy (AIT). Discussed in detail regarding the dosing, schedule, side effects (mild to moderate local allergic reaction and rarely systemic allergic reactions including anaphylaxis), and benefits (significant improvement in nasal symptoms, seasonal flares of asthma) of immunotherapy with the patient. There is significant time commitment involved with allergy shots, which includes weekly immunotherapy injections for  first 9-12 months and then biweekly to monthly injections for 3-5 years.  Skin: See below for proper skin care. Use desonide 0.05% ointment twice a day as needed for mild rash flares - okay to use on the face, neck, groin area. Do not use more than 1 week at a time.  Drug allergy: Continue to avoid Vicodin.  Follow up in 4 months or sooner if needed.   Reducing Pollen Exposure Pollen seasons: trees (spring), grass (summer) and ragweed/weeds (fall). Keep windows closed in your home and car to lower pollen exposure.  Install air conditioning in the bedroom and throughout the house if possible.  Avoid going out in dry windy days - especially early morning. Pollen counts are highest between 5 - 10 AM and on dry, hot and windy days.  Save outside activities for late afternoon or after a heavy rain, when pollen levels are lower.  Avoid mowing of grass if you have grass pollen allergy. Be aware that pollen can also be transported indoors on people and pets.  Dry your clothes in an automatic dryer rather than hanging them outside where they might collect pollen.  Rinse hair and eyes before bedtime. Mold Control Mold and fungi can grow on a variety of surfaces provided certain temperature and moisture conditions exist.  Outdoor molds grow on plants, decaying vegetation and soil. The major outdoor mold, Alternaria and Cladosporium, are found in very high numbers during hot and dry conditions. Generally, a late summer - fall peak is seen for common outdoor fungal spores. Rain will temporarily lower outdoor mold spore count, but counts rise rapidly when the rainy period ends. The most important indoor molds are Aspergillus and Penicillium. Dark, humid and poorly ventilated basements are ideal sites for mold growth. The next most common sites of mold growth are  the bathroom and the kitchen. Outdoor (Seasonal) Mold Control Use air conditioning and keep windows closed. Avoid exposure to decaying  vegetation. Avoid leaf raking. Avoid grain handling. Consider wearing a face mask if working in moldy areas.  Indoor (Perennial) Mold Control  Maintain humidity below 50%. Get rid of mold growth on hard surfaces with water, detergent and, if necessary, 5% bleach (do not mix with other cleaners). Then dry the area completely. If mold covers an area more than 10 square feet, consider hiring an indoor environmental professional. For clothing, washing with soap and water is best. If moldy items cannot be cleaned and dried, throw them away. Remove sources e.g. contaminated carpets. Repair and seal leaking roofs or pipes. Using dehumidifiers in damp basements may be helpful, but empty the water and clean units regularly to prevent mildew from forming. All rooms, especially basements, bathrooms and kitchens, require ventilation and cleaning to deter mold and mildew growth. Avoid carpeting on concrete or damp floors, and storing items in damp areas. Control of House Dust Mite Allergen Dust mite allergens are a common trigger of allergy and asthma symptoms. While they can be found throughout the house, these microscopic creatures thrive in warm, humid environments such as bedding, upholstered furniture and carpeting. Because so much time is spent in the bedroom, it is essential to reduce mite levels there.  Encase pillows, mattresses, and box springs in special allergen-proof fabric covers or airtight, zippered plastic covers.  Bedding should be washed weekly in hot water (130 F) and dried in a hot dryer. Allergen-proof covers are available for comforters and pillows that can't be regularly washed.  Wash the allergy-proof covers every few months. Minimize clutter in the bedroom. Keep pets out of the bedroom.  Keep humidity less than 50% by using a dehumidifier or air conditioning. You can buy a humidity measuring device called a hygrometer to monitor this.  If possible, replace carpets with hardwood,  linoleum, or washable area rugs. If that's not possible, vacuum frequently with a vacuum that has a HEPA filter. Remove all upholstered furniture and non-washable window drapes from the bedroom. Remove all non-washable stuffed toys from the bedroom.  Wash stuffed toys weekly. Pet Allergen Avoidance: Contrary to popular opinion, there are no "hypoallergenic" breeds of dogs or cats. That is because people are not allergic to an animal's hair, but to an allergen found in the animal's saliva, dander (dead skin flakes) or urine. Pet allergy symptoms typically occur within minutes. For some people, symptoms can build up and become most severe 8 to 12 hours after contact with the animal. People with severe allergies can experience reactions in public places if dander has been transported on the pet owners' clothing. Keeping an animal outdoors is only a partial solution, since homes with pets in the yard still have higher concentrations of animal allergens. Before getting a pet, ask your allergist to determine if you are allergic to animals. If your pet is already considered part of your family, try to minimize contact and keep the pet out of the bedroom and other rooms where you spend a great deal of time. As with dust mites, vacuum carpets often or replace carpet with a hardwood floor, tile or linoleum. High-efficiency particulate air (HEPA) cleaners can reduce allergen levels over time. While dander and saliva are the source of cat and dog allergens, urine is the source of allergens from rabbits, hamsters, mice and [place] pigs; so ask a non-allergic family member to clean the animal's cage. If  you have a pet allergy, talk to your allergist about the potential for allergy immunotherapy (allergy shots). This strategy can often provide long-term relief.  Skin care recommendations  Bath time: Always use lukewarm water. AVOID very hot or cold water. Keep bathing time to 5-10 minutes. Do NOT use bubble  bath. Use a mild soap and use just enough to wash the dirty areas. Do NOT scrub skin vigorously.  After bathing, pat dry your skin with a towel. Do NOT rub or scrub the skin.  Moisturizers and prescriptions:  ALWAYS apply moisturizers immediately after bathing (within 3 minutes). This helps to lock-in moisture. Use the moisturizer several times a day over the whole body. Good summer moisturizers include: Aveeno, CeraVe, Cetaphil. Good winter moisturizers include: Aquaphor, Vaseline, Cerave, Cetaphil, Eucerin, Vanicream. When using moisturizers along with medications, the moisturizer should be applied about one hour after applying the medication to prevent diluting effect of the medication or moisturize around where you applied the medications. When not using medications, the moisturizer can be continued twice daily as maintenance.  Laundry and clothing: Avoid laundry products with added color or perfumes. Use unscented hypo-allergenic laundry products such as Tide free, Cheer free & gentle, and All free and clear.  If the skin still seems dry or sensitive, you can try double-rinsing the clothes. Avoid tight or scratchy clothing such as wool. Do not use fabric softeners or dyer sheets

## 2021-08-10 NOTE — Assessment & Plan Note (Signed)
Past history - Diagnosed with asthma over 20 years ago and currently using albuterol on a daily basis for 1 month with good benefit.  Main triggers are dust, exercise and being outdoors. 2022 spirometry shows some restriction with 9% improvement in FEV1 post bronchodilator treatment.  Clinically feeling unchanged. Interim history - doing much better with Symbicort but noticed some hand shaking after using it.  Today's spirometry was normal. . Daily controller medication(s): Start Flovent 2 puffs twice a day with spacer and rinse mouth afterwards. Marland Kitchen Spacer sent in. o Stop Symbicort, if you notice worsening symptoms then let us know.  . May use albuterol rescue inhaler 2 puffs every 4 to 6 hours as needed for shortness of breath, chest tightness, coughing, and wheezing. May use albuterol rescue inhaler 2 puffs 5 to 15 minutes prior to strenuous physical activities. Monitor frequency of use.  . Get spirometry at next visit.

## 2021-08-10 NOTE — Progress Notes (Signed)
Follow Up Note  RE: Jill Reynolds MRN: [id number] DOB: 11/29/1995 Date of Office Visit: 08/10/2021  Referring provider: Latrelle Dodrill, MD Primary care provider: Latrelle Dodrill, MD  Chief Complaint: Follow-up  History of Present Illness: I had the pleasure of seeing Jill Reynolds for a follow up visit at the [facility] on 08/10/2021. She is a 25 y.o. female, who is being followed for asthma, allergic rhinoconjunctivitis, atopic dermatitis and drug allergy. Her previous allergy office visit was on 05/24/2021 with Dr. Selena Batten. Today is a regular follow up visit.  Asthma: Currently on Symbicort 2 puffs once a day but it causes some hand shaking. She does not tolerate twice a day. Breathing is much better.  No albuterol use recently.  Denies any SOB, coughing, wheezing, chest tightness, nocturnal awakenings, ER/urgent care visits or prednisone use since the last visit.  Allergic rhino conjunctivitis  Takes zyrtec 5mg  daily as needed with good benefit.  Interested in starting AIT but starting new job.    Other atopic dermatitis Used desonide prn with good benefit.   Adverse effect of other drugs Avoiding Vicodin  Assessment and Plan: Jill Reynolds is a 25 y.o. female with: Moderate persistent asthma without complication Past history - Diagnosed with asthma over 20 years ago and currently using albuterol on a daily basis for 1 month with good benefit.  Main triggers are dust, exercise and being outdoors. 2022 spirometry shows some restriction with 9% improvement in FEV1 post bronchodilator treatment.  Clinically feeling unchanged. Interim history - doing much better with Symbicort but noticed some hand shaking after using it. Today's spirometry was normal. Daily controller medication(s): Start Flovent 2023 2 puffs twice a day with spacer and rinse mouth afterwards. Spacer sent in. Stop Symbicort, if you notice worsening symptoms then let know.  May use  albuterol rescue inhaler 2 puffs every 4 to 6 hours as needed for shortness of breath, chest tightness, coughing, and wheezing. May use albuterol rescue inhaler 2 puffs 5 to 15 minutes prior to strenuous physical activities. Monitor frequency of use.  Get spirometry at next visit.  Seasonal and perennial allergic rhinoconjunctivitis Past history - Perennial rhinoconjunctivitis symptoms for 8 years with worsening in the spring.  Skin testing many years ago showed multiple positives and was on allergy immunotherapy for 1 to 2 years with unknown benefit. 2022 skin testing showed: Positive to grass, ragweed, trees, mold, dust mites, cat, dog. Interim history - interested in starting AIT but insurance is changing with new job.  Continue environmental control measures as below. Use over the counter antihistamines such as Zyrtec (cetirizine), Claritin (loratadine), Allegra (fexofenadine), or Xyzal (levocetirizine) daily as needed.  May switch antihistamines every few months. Nasal saline spray (i.e., Simply Saline) or nasal saline lavage (i.e., NeilMed) is recommended as needed and prior to medicated nasal sprays. Start allergy injections - patient will let 2023 know when ready to start once insurance changes. Had a detailed discussion with patient/family that clinical history is suggestive of allergic rhinitis, and may benefit from allergy immunotherapy (AIT). Discussed in detail regarding the dosing, schedule, side effects (mild to moderate local allergic reaction and rarely systemic allergic reactions including anaphylaxis), and benefits (significant improvement in nasal symptoms, seasonal flares of asthma) of immunotherapy with the patient. There is significant time commitment involved with allergy shots, which includes weekly immunotherapy injections for first 9-12 months and then biweekly to monthly injections for 3-5 years.   Other atopic dermatitis Past history -  Usually on the face. Using triamcinolone  prn with good benefit. Interim history - stable. See below for proper skin care. Use desonide 0.05% ointment twice a day as needed for mild rash flares - okay to use on the face, neck, groin area. Do not use more than 1 week at a time.  Adverse effect of other drugs, medicaments and biological substances, subsequent encounter Past history - Throat swelling, hives, lip swelling, dizziness and was treated epinephrine after Vicodin. Continue to avoid Vicodin.  Return in about 4 months (around 12/10/2021).  Meds ordered this encounter  Medications   fluticasone (FLOVENT HFA) 110 MCG/ACT inhaler    Sig: Inhale 2 puffs into the lungs in the morning and at bedtime. with spacer and rinse mouth afterwards.    Dispense:  1 each    Refill:  3   Spacer/Aero-Holding Chambers (AEROCHAMBER PLUS) inhaler    Sig: Use as instructed    Dispense:  1 each    Refill:  2    Lab Orders  No laboratory test(s) ordered today    Diagnostics: Spirometry:  Tracings reviewed. Her effort: Good reproducible efforts. FVC: 2.41L FEV1: 1.85L, 71% predicted FEV1/FVC ratio: 77% Interpretation: Spirometry consistent with normal pattern.  Please see scanned spirometry results for details.  Medication List:  Current Outpatient Medications  Medication Sig Dispense Refill   albuterol (VENTOLIN HFA) 108 (90 Base) MCG/ACT inhaler INHALE 2 PUFFS INTO THE LUNGS EVERY 4 HOURS AS NEEDED 18 g 1   cetirizine (ZYRTEC) 10 MG tablet Take one tablet by mouth daily. 90 tablet 3   desonide (DESOWEN) 0.05 % ointment Apply 1 application topically 2 (two) times daily as needed (rash). 15 g 2   EPINEPHrine 0.3 mg/0.3 mL IJ SOAJ injection Inject 0.3 mLs (0.3 mg total) into the muscle as directed. 1 each 0   fluticasone (FLOVENT HFA) 110 MCG/ACT inhaler Inhale 2 puffs into the lungs in the morning and at bedtime. with spacer and rinse mouth afterwards. 1 each 3   Spacer/Aero-Holding Chambers (AEROCHAMBER PLUS) inhaler Use as instructed  1 each 2   Spacer/Aero-Holding Chambers (AEROCHAMBER PLUS) inhaler Use as instructed 1 each 2   triamcinolone (KENALOG) 0.1 % Apply 1 application topically 2 (two) times daily. 30 g 1   No current facility-administered medications for this visit.   Allergies: Allergies  Allergen Reactions   Vicodin [Hydrocodone-Acetaminophen] Anaphylaxis   I reviewed her past medical history, social history, family history, and environmental history and no significant changes have been reported from her previous visit.  Review of Systems  Constitutional:  Negative for appetite change, chills, fever and unexpected weight change.  HENT:  Negative for congestion and rhinorrhea.   Eyes:  Negative for itching.  Respiratory:  Negative for cough, chest tightness, shortness of breath and wheezing.   Cardiovascular:  Negative for chest pain.  Gastrointestinal:  Negative for abdominal pain.  Genitourinary:  Negative for difficulty urinating.  Skin:  Negative for rash.  Allergic/Immunologic: Positive for environmental allergies. Negative for food allergies.  Neurological:  Negative for headaches.   Objective: BP 108/68   Pulse 86   Temp 98.1 F (36.7 C) (Temporal)   Resp 16   Ht 5\' 2"  (1.575 m)   Wt 222 lb 4 oz (100.8 kg)   SpO2 97%   BMI 40.65 kg/m  Body mass index is 40.65 kg/m. Physical Exam Vitals and nursing note reviewed.  Constitutional:      Appearance: Normal appearance. She is well-developed.  HENT:  Head: Normocephalic and atraumatic.     Right Ear: External ear normal. There is impacted cerumen.     Left Ear: External ear normal. There is impacted cerumen.     Nose: Congestion and rhinorrhea present.     Mouth/Throat:     Mouth: Mucous membranes are moist.     Pharynx: Oropharynx is clear.  Eyes:     Conjunctiva/sclera: Conjunctivae normal.  Cardiovascular:     Rate and Rhythm: Normal rate and regular rhythm.     Heart sounds: Normal heart sounds. No murmur heard. Pulmonary:      Effort: Pulmonary effort is normal.     Breath sounds: Normal breath sounds. No wheezing, rhonchi or rales.  Musculoskeletal:     Cervical back: Neck supple.  Skin:    General: Skin is warm.     Findings: No rash.  Neurological:     Mental Status: She is alert and oriented to person, place, and time.  Psychiatric:        Behavior: Behavior normal.   Previous notes and tests were reviewed. The plan was reviewed with the patient/family, and all questions/concerned were addressed.  It was my pleasure to see Jill Reynolds today and participate in her care. Please feel free to contact me with any questions or concerns.  Sincerely,  Wyline Mood, DO Allergy & Immunology  [facility] office: [phone] [facility] office: [phone]

## 2021-08-10 NOTE — Assessment & Plan Note (Signed)
Past history - Usually on the face. Using triamcinolone prn with good benefit. Interim history - stable. . See below for proper skin care. . Use desonide 0.05% ointment twice a day as needed for mild rash flares - okay to use on the face, neck, groin area. Do not use more than 1 week at a time.

## 2021-08-13 DIAGNOSIS — Z419 Encounter for procedure for purposes other than remedying health state, unspecified: Secondary | ICD-10-CM | POA: Diagnosis not present

## 2021-09-13 DIAGNOSIS — Z419 Encounter for procedure for purposes other than remedying health state, unspecified: Secondary | ICD-10-CM | POA: Diagnosis not present

## 2021-10-13 DIAGNOSIS — Z419 Encounter for procedure for purposes other than remedying health state, unspecified: Secondary | ICD-10-CM | POA: Diagnosis not present

## 2021-11-13 DIAGNOSIS — Z419 Encounter for procedure for purposes other than remedying health state, unspecified: Secondary | ICD-10-CM | POA: Diagnosis not present

## 2021-11-15 NOTE — Progress Notes (Deleted)
Follow Up Note  RE: Doristine Countersabi Kassebaum MRN: [id number] DOB: 01/04/1996 Date of Office Visit: 11/16/2021  Referring provider: Latrelle DodrillMcIntyre, Brittany J, MD Primary care provider: Latrelle DodrillMcIntyre, Brittany J, MD  Chief Complaint: No chief complaint on file.  History of Present Illness: I had the pleasure of seeing Jill Reynolds for a follow up visit at the [facility] on 11/15/2021. She is a 26 y.o. female, who is being followed for asthma, allergic rhinoconjunctivitis, atopic dermatitis and drug allergies. Her previous allergy office visit was on 08/10/2021 with Dr. Selena BattenKim. Today is a regular follow up visit.  Moderate persistent asthma without complication Past history - Diagnosed with asthma over 20 years ago and currently using albuterol on a daily basis for 1 month with good benefit.  Main triggers are dust, exercise and being outdoors. 2022 spirometry shows some restriction with 9% improvement in FEV1 post bronchodilator treatment.  Clinically feeling unchanged. Interim history - doing much better with Symbicort but noticed some hand shaking after using it. Today's spirometry was normal. Daily controller medication(s): Start Flovent 110mcg 2 puffs twice a day with spacer and rinse mouth afterwards. Spacer sent in. Stop Symbicort, if you notice worsening symptoms then let us know.  May use albuterol rescue inhaler 2 puffs every 4 to 6 hours as needed for shortness of breath, chest tightness, coughing, and wheezing. May use albuterol rescue inhaler 2 puffs 5 to 15 minutes prior to strenuous physical activities. Monitor frequency of use.  Get spirometry at next visit.   Seasonal and perennial allergic rhinoconjunctivitis Past history - Perennial rhinoconjunctivitis symptoms for 8 years with worsening in the spring.  Skin testing many years ago showed multiple positives and was on allergy immunotherapy for 1 to 2 years with unknown benefit. 2022 skin testing showed: Positive to grass, ragweed, trees,  mold, dust mites, cat, dog. Interim history - interested in starting AIT but insurance is changing with new job.  Continue environmental control measures as below. Use over the counter antihistamines such as Zyrtec (cetirizine), Claritin (loratadine), Allegra (fexofenadine), or Xyzal (levocetirizine) daily as needed.  May switch antihistamines every few months. Nasal saline spray (i.e., Simply Saline) or nasal saline lavage (i.e., NeilMed) is recommended as needed and prior to medicated nasal sprays. Start allergy injections - patient will let us know when ready to start once insurance changes. Had a detailed discussion with patient/family that clinical history is suggestive of allergic rhinitis, and may benefit from allergy immunotherapy (AIT). Discussed in detail regarding the dosing, schedule, side effects (mild to moderate local allergic reaction and rarely systemic allergic reactions including anaphylaxis), and benefits (significant improvement in nasal symptoms, seasonal flares of asthma) of immunotherapy with the patient. There is significant time commitment involved with allergy shots, which includes weekly immunotherapy injections for first 9-12 months and then biweekly to monthly injections for 3-5 years.    Other atopic dermatitis Past history - Usually on the face. Using triamcinolone prn with good benefit. Interim history - stable. See below for proper skin care. Use desonide 0.05% ointment twice a day as needed for mild rash flares - okay to use on the face, neck, groin area. Do not use more than 1 week at a time.   Adverse effect of other drugs, medicaments and biological substances, subsequent encounter Past history - Throat swelling, hives, lip swelling, dizziness and was treated epinephrine after Vicodin. Continue to avoid Vicodin.    Assessment and Plan: Jill Reynolds is a 26 y.o. female with: No problem-specific Assessment &  Plan notes found for this encounter.  No follow-ups on  file.  No orders of the defined types were placed in this encounter.  Lab Orders  No laboratory test(s) ordered today    Diagnostics: Spirometry:  Tracings reviewed. Her effort: {Blank single:19197::"Good reproducible efforts.","It was hard to get consistent efforts and there is a question as to whether this reflects a maximal maneuver.","Poor effort, data can not be interpreted."} FVC: ***L FEV1: ***L, ***% predicted FEV1/FVC ratio: ***% Interpretation: {Blank single:19197::"Spirometry consistent with mild obstructive disease","Spirometry consistent with moderate obstructive disease","Spirometry consistent with severe obstructive disease","Spirometry consistent with possible restrictive disease","Spirometry consistent with mixed obstructive and restrictive disease","Spirometry uninterpretable due to technique","Spirometry consistent with normal pattern","No overt abnormalities noted given today's efforts"}.  Please see scanned spirometry results for details.  Skin Testing: {Blank single:19197::"Select foods","Environmental allergy panel","Environmental allergy panel and select foods","Food allergy panel","None","Deferred due to recent antihistamines use"}. *** Results discussed with patient/family.   Medication List:  Current Outpatient Medications  Medication Sig Dispense Refill   albuterol (VENTOLIN HFA) 108 (90 Base) MCG/ACT inhaler INHALE 2 PUFFS INTO THE LUNGS EVERY 4 HOURS AS NEEDED 18 g 1   cetirizine (ZYRTEC) 10 MG tablet Take one tablet by mouth daily. 90 tablet 3   desonide (DESOWEN) 0.05 % ointment Apply 1 application topically 2 (two) times daily as needed (rash). 15 g 2   EPINEPHrine 0.3 mg/0.3 mL IJ SOAJ injection Inject 0.3 mLs (0.3 mg total) into the muscle as directed. 1 each 0   fluticasone (FLOVENT HFA) 110 MCG/ACT inhaler Inhale 2 puffs into the lungs in the morning and at bedtime. with spacer and rinse mouth afterwards. 1 each 3   Spacer/Aero-Holding Chambers  (AEROCHAMBER PLUS) inhaler Use as instructed 1 each 2   Spacer/Aero-Holding Chambers (AEROCHAMBER PLUS) inhaler Use as instructed 1 each 2   triamcinolone (KENALOG) 0.1 % Apply 1 application topically 2 (two) times daily. 30 g 1   No current facility-administered medications for this visit.   Allergies: Allergies  Allergen Reactions   Vicodin [Hydrocodone-Acetaminophen] Anaphylaxis   I reviewed her past medical history, social history, family history, and environmental history and no significant changes have been reported from her previous visit.  Review of Systems  Constitutional:  Negative for appetite change, chills, fever and unexpected weight change.  HENT:  Negative for congestion and rhinorrhea.   Eyes:  Negative for itching.  Respiratory:  Negative for cough, chest tightness, shortness of breath and wheezing.   Cardiovascular:  Negative for chest pain.  Gastrointestinal:  Negative for abdominal pain.  Genitourinary:  Negative for difficulty urinating.  Skin:  Negative for rash.  Allergic/Immunologic: Positive for environmental allergies. Negative for food allergies.  Neurological:  Negative for headaches.   Objective: There were no vitals taken for this visit. There is no height or weight on file to calculate BMI. Physical Exam Vitals and nursing note reviewed.  Constitutional:      Appearance: Normal appearance. She is well-developed.  HENT:     Head: Normocephalic and atraumatic.     Right Ear: External ear normal. There is impacted cerumen.     Left Ear: External ear normal. There is impacted cerumen.     Nose: Congestion and rhinorrhea present.     Mouth/Throat:     Mouth: Mucous membranes are moist.     Pharynx: Oropharynx is clear.  Eyes:     Conjunctiva/sclera: Conjunctivae normal.  Cardiovascular:     Rate and Rhythm: Normal rate and regular rhythm.  Heart sounds: Normal heart sounds. No murmur heard. Pulmonary:     Effort: Pulmonary effort is normal.      Breath sounds: Normal breath sounds. No wheezing, rhonchi or rales.  Musculoskeletal:     Cervical back: Neck supple.  Skin:    General: Skin is warm.     Findings: No rash.  Neurological:     Mental Status: She is alert and oriented to person, place, and time.  Psychiatric:        Behavior: Behavior normal.   Previous notes and tests were reviewed. The plan was reviewed with the patient/family, and all questions/concerned were addressed.  It was my pleasure to see Jill Reynolds today and participate in her care. Please feel free to contact me with any questions or concerns.  Sincerely,  Wyline Mood, DO Allergy & Immunology  [facility] office: [phone] [facility] office: [phone]

## 2021-11-16 ENCOUNTER — Ambulatory Visit: Payer: Medicaid Other | Admitting: Allergy

## 2021-11-16 DIAGNOSIS — L2089 Other atopic dermatitis: Secondary | ICD-10-CM

## 2021-11-16 DIAGNOSIS — J4540 Moderate persistent asthma, uncomplicated: Secondary | ICD-10-CM

## 2021-11-16 DIAGNOSIS — H1010 Acute atopic conjunctivitis, unspecified eye: Secondary | ICD-10-CM

## 2021-11-16 DIAGNOSIS — T50995D Adverse effect of other drugs, medicaments and biological substances, subsequent encounter: Secondary | ICD-10-CM

## 2021-12-14 DIAGNOSIS — Z419 Encounter for procedure for purposes other than remedying health state, unspecified: Secondary | ICD-10-CM | POA: Diagnosis not present

## 2022-01-11 DIAGNOSIS — Z419 Encounter for procedure for purposes other than remedying health state, unspecified: Secondary | ICD-10-CM | POA: Diagnosis not present

## 2022-01-21 ENCOUNTER — Other Ambulatory Visit: Payer: Self-pay | Admitting: Student in an Organized Health Care Education/Training Program

## 2022-01-22 ENCOUNTER — Other Ambulatory Visit: Payer: Self-pay | Admitting: Family Medicine

## 2022-01-22 ENCOUNTER — Other Ambulatory Visit: Payer: Self-pay | Admitting: Allergy

## 2022-01-22 DIAGNOSIS — Z9109 Other allergy status, other than to drugs and biological substances: Secondary | ICD-10-CM

## 2022-04-18 ENCOUNTER — Encounter: Payer: Self-pay | Admitting: *Deleted

## 2022-05-03 ENCOUNTER — Encounter: Payer: Medicaid Other | Admitting: Family Medicine

## 2022-05-04 ENCOUNTER — Encounter: Payer: Medicaid Other | Admitting: Family Medicine

## 2022-06-06 ENCOUNTER — Encounter: Payer: Medicaid Other | Admitting: Family Medicine

## 2022-06-06 DIAGNOSIS — Z13220 Encounter for screening for lipoid disorders: Secondary | ICD-10-CM

## 2022-06-06 DIAGNOSIS — E2839 Other primary ovarian failure: Secondary | ICD-10-CM

## 2022-06-07 ENCOUNTER — Other Ambulatory Visit: Payer: Self-pay | Admitting: Family Medicine

## 2022-06-09 ENCOUNTER — Other Ambulatory Visit (HOSPITAL_COMMUNITY): Payer: Self-pay

## 2022-06-09 MED ORDER — EPINEPHRINE 0.3 MG/0.3ML IJ SOAJ
0.3000 mg | INTRAMUSCULAR | 0 refills | Status: DC
  Filled 2022-06-09: qty 2, 30d supply, fill #0

## 2022-06-12 ENCOUNTER — Encounter: Payer: Self-pay | Admitting: Family Medicine

## 2022-06-12 ENCOUNTER — Ambulatory Visit (INDEPENDENT_AMBULATORY_CARE_PROVIDER_SITE_OTHER): Payer: Medicaid Other | Admitting: Family Medicine

## 2022-06-12 VITALS — BP 130/74 | HR 93 | Temp 98.6°F | Ht 62.0 in | Wt 230.6 lb

## 2022-06-12 DIAGNOSIS — J4520 Mild intermittent asthma, uncomplicated: Secondary | ICD-10-CM

## 2022-06-12 DIAGNOSIS — Z0000 Encounter for general adult medical examination without abnormal findings: Secondary | ICD-10-CM | POA: Diagnosis not present

## 2022-06-12 MED ORDER — ALBUTEROL SULFATE HFA 108 (90 BASE) MCG/ACT IN AERS: INHALATION_SPRAY | RESPIRATORY_TRACT | 1 refills | Status: DC

## 2022-06-12 NOTE — Progress Notes (Signed)
    SUBJECTIVE:   CHIEF COMPLAINT / HPI: Work physical  AW is a 26yo F w/ asthma that is here for a work physical. Pt is starting nursing school. Pt exercises with weight lifting. Asthma is well controlled on current regimen. Reports having MMR vaccine as a child and needs titers checked for work.    OBJECTIVE:   BP 130/74   Pulse 93   Temp 98.6 F (37 C)   Ht $R'5\' 2"'PW$  (1.575 m)   Wt 230 lb 9.6 oz (104.6 kg)   LMP 06/10/2022 (Approximate)   SpO2 98%   BMI 42.18 kg/m   Gen: Well-appear woman resting comfortably Pulm: CTAB, no wheezing. Good air movement throughout.  CV: RRR, no murmurs. No edema Abm: Soft, nondistended, nontender. Normal BS Skin: No rashes or lesions  ASSESSMENT/PLAN:   Physical exam, annual Physical exam for starting nursing school. Asthma well controlled. No concerns at this time. - School form completed and returned to pt - F/u MMR titer - Plans to get Pap smear in September when her insurance switches   Arlyce Dice, MD [place]

## 2022-06-12 NOTE — Assessment & Plan Note (Addendum)
Physical exam for starting nursing school. Asthma well controlled. No concerns at this time. - School form completed and returned to pt - F/u MMR titer - Plans to get Pap smear in September when her insurance switches

## 2022-06-12 NOTE — Patient Instructions (Signed)
Good to see you today - Thank you for coming in!  Things we discussed today:  We completed your work physical and checked your MMR titers. If your titers are abnormal, I will reach out via phone. If it is normal, I will update you via MyChart.  You are due for a Pap smear, please schedule one with our clinic.

## 2022-06-13 LAB — MEASLES/MUMPS/RUBELLA IMMUNITY
MUMPS ABS, IGG: 283 AU/mL (ref >10.9)
RUBEOLA AB, IGG: 221 AU/mL (ref >16.4)
Rubella Antibodies, IGG: 11.4 index (ref >0.99)

## 2022-06-14 ENCOUNTER — Other Ambulatory Visit (HOSPITAL_COMMUNITY): Payer: Self-pay

## 2022-06-20 ENCOUNTER — Other Ambulatory Visit (HOSPITAL_COMMUNITY): Payer: Self-pay

## 2022-07-07 ENCOUNTER — Other Ambulatory Visit (HOSPITAL_COMMUNITY): Payer: Self-pay

## 2022-12-12 ENCOUNTER — Encounter: Payer: Self-pay | Admitting: Family Medicine

## 2023-02-22 LAB — HM PAP SMEAR

## 2023-05-14 ENCOUNTER — Encounter: Payer: Self-pay | Admitting: Family Medicine

## 2023-08-07 ENCOUNTER — Encounter: Payer: Medicaid Other | Admitting: Family Medicine

## 2023-08-08 ENCOUNTER — Encounter: Payer: Self-pay | Admitting: Family Medicine

## 2023-08-08 ENCOUNTER — Ambulatory Visit (INDEPENDENT_AMBULATORY_CARE_PROVIDER_SITE_OTHER): Payer: Self-pay | Admitting: Family Medicine

## 2023-08-08 VITALS — BP 125/76 | HR 92 | Ht 62.0 in | Wt 198.8 lb

## 2023-08-08 DIAGNOSIS — Z0000 Encounter for general adult medical examination without abnormal findings: Secondary | ICD-10-CM

## 2023-08-08 DIAGNOSIS — Z889 Allergy status to unspecified drugs, medicaments and biological substances status: Secondary | ICD-10-CM

## 2023-08-08 DIAGNOSIS — Z111 Encounter for screening for respiratory tuberculosis: Secondary | ICD-10-CM

## 2023-08-08 DIAGNOSIS — Z113 Encounter for screening for infections with a predominantly sexual mode of transmission: Secondary | ICD-10-CM

## 2023-08-08 DIAGNOSIS — J4520 Mild intermittent asthma, uncomplicated: Secondary | ICD-10-CM

## 2023-08-08 DIAGNOSIS — Z23 Encounter for immunization: Secondary | ICD-10-CM

## 2023-08-08 MED ORDER — ALBUTEROL SULFATE HFA 108 (90 BASE) MCG/ACT IN AERS: INHALATION_SPRAY | RESPIRATORY_TRACT | 1 refills | Status: AC

## 2023-08-08 MED ORDER — EPINEPHRINE 0.3 MG/0.3ML IJ SOAJ
0.3000 mg | INTRAMUSCULAR | 0 refills | Status: DC
Start: 2023-08-08 — End: 2024-05-19

## 2023-08-08 NOTE — Patient Instructions (Signed)
All you know if any of your results from today are abnormal.  If everything looks good, I will send a message on MyChart.

## 2023-08-08 NOTE — Assessment & Plan Note (Signed)
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  Flu shot today.  Sent in refill of her inhaler and EpiPen.  Follow-up 1 year

## 2023-08-08 NOTE — Progress Notes (Signed)
    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  Also requesting STD testing (only blood test) for HIV and RPR Requesting Refill of inhaler and epi pen  Denies significant shortness of breath, chest pain, abdominal pain, nausea/vomiting, recent illness, vaginal discharge or abnormal bleeding  Prefers to defer birth control conversations at this time   PERTINENT  PMH / PSH: Allergy, asthma  OBJECTIVE:   BP 125/76   Pulse 92   Ht 5\' 2"  (1.575 m)   Wt 198 lb 12.8 oz (90.2 kg)   LMP 07/12/2023   SpO2 100%   BMI 36.36 kg/m    General: NAD, pleasant, able to participate in exam Cardiac: RRR, no murmurs auscultated Respiratory: CTAB, normal WOB Abdomen: soft, non-tender, non-distended, normoactive bowel sounds Extremities: warm and well perfused, no edema or cyanosis Skin: warm and dry, no rashes noted Neuro: alert, no obvious focal deficits, speech normal Psych: Normal affect and mood  ASSESSMENT/PLAN:   Assessment & Plan Physical exam, annual No concerns, 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  Flu shot today.  Sent in refill of her inhaler and EpiPen.  Follow-up 1 year   Vonna Drafts, MD [facility]

## 2023-08-09 ENCOUNTER — Encounter: Payer: Self-pay | Admitting: Family Medicine

## 2023-08-12 LAB — RPR: RPR Ser Ql: NONREACTIVE

## 2023-08-12 LAB — QUANTIFERON-TB GOLD PLUS
QuantiFERON Mitogen Value: 6.24 [IU]/mL
QuantiFERON Nil Value: 0.01 [IU]/mL
QuantiFERON TB1 Ag Value: 0.01 [IU]/mL
QuantiFERON TB2 Ag Value: 0.01 [IU]/mL

## 2023-08-12 LAB — HIV ANTIBODY (ROUTINE TESTING W REFLEX): HIV Screen 4th Generation wRfx: NONREACTIVE

## 2024-01-01 ENCOUNTER — Ambulatory Visit (INDEPENDENT_AMBULATORY_CARE_PROVIDER_SITE_OTHER): Payer: PRIVATE HEALTH INSURANCE | Admitting: Student

## 2024-01-01 VITALS — BP 132/80 | HR 111 | Temp 98.2°F | Ht 61.0 in | Wt 204.2 lb

## 2024-01-01 DIAGNOSIS — J4521 Mild intermittent asthma with (acute) exacerbation: Secondary | ICD-10-CM | POA: Diagnosis not present

## 2024-01-01 DIAGNOSIS — D509 Iron deficiency anemia, unspecified: Secondary | ICD-10-CM

## 2024-01-01 MED ORDER — PREDNISONE 20 MG PO TABS
40.0000 mg | ORAL_TABLET | Freq: Every day | ORAL | 0 refills | Status: AC
Start: 2024-01-01 — End: 2024-01-06

## 2024-01-01 NOTE — Patient Instructions (Signed)
I am prescribing 5-day course of prednisone.  This should hopefully calm down the inflammation in your lungs and make you feel better.  If you do not feel better after the steroids please come back to be seen.  I am testing your blood to check for anemia and will also be checking iron studies.  If any of these levels are low we may need to start you back on iron supplementation.

## 2024-01-01 NOTE — Progress Notes (Signed)
    SUBJECTIVE:   CHIEF COMPLAINT / HPI:   Jill Reynolds is a 28 y.o. female  presenting for asthma exacerbation.   She reports getting COVID 2 weeks ago and having a persistent cough and wheezing afterwards.  She has been using her albuterol inhaler consistently throughout the day anytime she does physical activity.  She denies signs respiratory distress and she has been sleeping comfortably throughout the night.  Prior to her illness she did not use her albuterol consistently and her asthma was well-controlled.  She has a history of iron deficiency anemia that was worked up and found to have sickle cell trait.  She has not had labs done in a while and she reports being very fatigued.  She would like to have labs redrawn today.   PERTINENT  PMH / PSH: Reviewed and updated   OBJECTIVE:   BP 132/80   Pulse (!) 111   Temp 98.2 F (36.8 C)   Ht 5\' 1"  (1.549 m)   Wt 204 lb 3.2 oz (92.6 kg)   SpO2 100%   BMI 38.58 kg/m   ill-appearing, no acute distress HEENT: erythematous nasal turbinates, clear TMs bilaterally, mild cervical lymphadenopathy bilaterally. Mild maxillary sinus tenderness Cardio: Regular rate, regular rhythm, no murmurs on exam. <2 sec capillary refill  Pulm: Clear, diffuse expiratory wheezing, no crackles. No increased work of breathing Abdominal: bowel sounds present, soft, non-tender, non-distended  ASSESSMENT/PLAN:   Asthma exacerbation:  No signs of respiratory distress on exam. Patient appears well hydrated.  Most likely viral mediated.  Instructed her to continue using albuterol 4 puffs every 4 hours.  Also prescribed 5-day course of 40 mg prednisone.  Patient instructed to return to the office if she did not improve after steroid.  Iron deficiency anemia: Will check CBC and iron panel today.  Glendale Chard, DO [facility]

## 2024-01-02 ENCOUNTER — Telehealth: Payer: Self-pay | Admitting: Student

## 2024-01-02 ENCOUNTER — Encounter: Payer: Self-pay | Admitting: Student

## 2024-01-02 ENCOUNTER — Ambulatory Visit: Payer: Self-pay | Admitting: Family Medicine

## 2024-01-02 DIAGNOSIS — D509 Iron deficiency anemia, unspecified: Secondary | ICD-10-CM

## 2024-01-02 LAB — CBC
Hematocrit: 27 % — ABNORMAL LOW (ref 34.0–46.6)
Hemoglobin: 7.1 g/dL — ABNORMAL LOW (ref 11.1–15.9)
MCH: 14.9 pg — ABNORMAL LOW (ref 26.6–33.0)
MCHC: 26.3 g/dL — ABNORMAL LOW (ref 31.5–35.7)
MCV: 57 fL — ABNORMAL LOW (ref 79–97)
Platelets: 198 10*3/uL (ref 150–450)
RBC: 4.75 x10E6/uL (ref 3.77–5.28)
RDW: 22 % — ABNORMAL HIGH (ref 11.7–15.4)
WBC: 7.3 10*3/uL (ref 3.4–10.8)

## 2024-01-02 LAB — IRON,TIBC AND FERRITIN PANEL
Ferritin: 5 ng/mL — ABNORMAL LOW (ref 15–150)
Iron Saturation: 3 % — CL (ref 15–55)
Iron: 10 ug/dL — ABNORMAL LOW (ref 27–159)
Total Iron Binding Capacity: 389 ug/dL (ref 250–450)
UIBC: 379 ug/dL (ref 131–425)

## 2024-01-02 NOTE — Telephone Encounter (Signed)
Called patient to discuss lab results. Left HIPAA compliant voicemail.   CBC abnormal with Hgb of 7.1 and iron studies showing very low iron. I am referring her to receive IV iron outpatient. She should receive a call from the pharmacy department to schedule.   Patient assured me at our visit that she did not have heavy menstrual bleeding and the cause of her iron deficiency anemia was found to be related to the sickle cell trait. Patient will most likely need referral to hematology for further workup.   Evaluated by GI in 2018 for iron deficiency anemia but was thought to be related to heavy menstrual bleeding and nutritional deficiencies.   She was tired at yesterdays visit but denied LOC, falls. Her difficulty breathing was attributed to her current asthma exacerbation. If she begins to have severe vaginal bleeding or other bleeding she will need to go to the Emergency Room for evaluation.   Glendale Chard, DO [facility], PGY-2 01/02/24 8:27 AM

## 2024-01-02 NOTE — Telephone Encounter (Signed)
Patient missed call from Dr. Hyacinth Meeker. Patient was given Dr. Hyacinth Meeker comments as listed below. Patient denies heavy bleeding or any other symptoms. Advised to call PCP office to follow-up on Hematology referral. Patient verbalized understanding.  Jill Chard, DO    01/02/24  8:30 AM Note Called patient to discuss lab results. Left HIPAA compliant voicemail.    CBC abnormal with Hgb of 7.1 and iron studies showing very low iron. I am referring her to receive IV iron outpatient. She should receive a call from the pharmacy department to schedule.    Patient assured me at our visit that she did not have heavy menstrual bleeding and the cause of her iron deficiency anemia was found to be related to the sickle cell trait. Patient will most likely need referral to hematology for further workup.    Evaluated by GI in 2018 for iron deficiency anemia but was thought to be related to heavy menstrual bleeding and nutritional deficiencies.    She was tired at yesterdays visit but denied LOC, falls. Her difficulty breathing was attributed to her current asthma exacerbation. If she begins to have severe vaginal bleeding or other bleeding she will need to go to the Emergency Room for evaluation.    Jill Chard, DO [facility], PGY-2 01/02/24 8:27 AM       Copied from CRM [id number]. Topic: Clinical - Red Word Triage >> Jan 02, 2024  3:37 PM Gaetano Hawthorne wrote: Red Word that prompted transfer to Nurse Triage: Patient was see by her PCP and referred to get an IRON IV infusion (she hasn't received a call for scheduling at this time) - she reviewed her lab results on MyChart and they are much lower than usual - she would like to know if this is something that can wait or does she need to go get the infusion done now?

## 2024-01-03 ENCOUNTER — Telehealth: Payer: PRIVATE HEALTH INSURANCE | Admitting: Physician Assistant

## 2024-01-03 ENCOUNTER — Telehealth: Payer: Self-pay

## 2024-01-03 DIAGNOSIS — Z30012 Encounter for prescription of emergency contraception: Secondary | ICD-10-CM

## 2024-01-03 DIAGNOSIS — Z7251 High risk heterosexual behavior: Secondary | ICD-10-CM

## 2024-01-03 MED ORDER — LEVONORGESTREL 1.5 MG PO TABS
1.5000 mg | ORAL_TABLET | Freq: Once | ORAL | 0 refills | Status: AC
Start: 2024-01-03 — End: 2024-01-03

## 2024-01-03 NOTE — Progress Notes (Signed)
Virtual Visit Consent   Jill Reynolds, you are scheduled for a virtual visit with a [facility] provider today. Just as with appointments in the office, your consent must be obtained to participate. Your consent will be active for this visit and any virtual visit you may have with one of our providers in the next 365 days. If you have a MyChart account, a copy of this consent can be sent to you electronically.  As this is a virtual visit, video technology does not allow for your provider to perform a traditional examination. This may limit your provider's ability to fully assess your condition. If your provider identifies any concerns that need to be evaluated in person or the need to arrange testing (such as labs, EKG, etc.), we will make arrangements to do so. Although advances in technology are sophisticated, we cannot ensure that it will always work on either your end or our end. If the connection with a video visit is poor, the visit may have to be switched to a telephone visit. With either a video or telephone visit, we are not always able to ensure that we have a secure connection.  By engaging in this virtual visit, you consent to the provision of healthcare and authorize for your insurance to be billed (if applicable) for the services provided during this visit. Depending on your insurance coverage, you may receive a charge related to this service.  I need to obtain your verbal consent now. Are you willing to proceed with your visit today? Jill Reynolds has provided verbal consent on 01/03/2024 for a virtual visit (video or telephone). Gilberto Better, [place]  Date: 01/03/2024 6:50 PM   Virtual Visit via Video Note   I, Jill Reynolds, connected with  Jill Reynolds  ([id number], 01/10/1996) on 01/03/24 at  6:45 PM EST by a video-enabled telemedicine application and verified that I am speaking with the correct person using two identifiers.  Location: Patient: Virtual Visit Location Patient: Home Provider:  Virtual Visit Location Provider: Home Office   I discussed the limitations of evaluation and management by telemedicine and the availability of in person appointments. The patient expressed understanding and agreed to proceed.    History of Present Illness: Jill Reynolds is a 28 y.o. who identifies as a female who was assigned female at birth, and is being seen today for requesting Rx for emergency contraception.  HPI: 28 y/o F presents via video telehealth visit for requesting for a Rx for emergency contraception. Pt states that she had condom break and would like to get a Rx for emergency contraception. She is aware that this can be purchased over the counter, but would like a Rx.    Medication Refill    Problems:  Patient Active Problem List   Diagnosis Date Noted   Physical exam, annual 06/12/2022   Seasonal and perennial allergic rhinoconjunctivitis 08/10/2021   Adverse effect of other drugs, medicaments and biological substances, subsequent encounter 05/24/2021   Hypomenorrhea 02/24/2021   History of allergy 02/24/2021   Hyperglycemia 02/06/2020   Iron deficiency anemia 10/24/2016   Pica 10/23/2016   Other atopic dermatitis 09/04/2014   Sickle cell trait (HCC) 02/07/2012   Moderate persistent asthma without complication 01/10/2007    Allergies:  Allergies  Allergen Reactions   Vicodin [Hydrocodone-Acetaminophen] Anaphylaxis   Medications:  Current Outpatient Medications:    levonorgestrel (PLAN B ONE-STEP) 1.5 MG tablet, Take 1 tablet (1.5 mg total) by mouth once for 1 dose., Disp: 1 tablet, Rfl: 0  albuterol (VENTOLIN HFA) 108 (90 Base) MCG/ACT inhaler, INHALE 2 PUFFS INTO THE LUNGS EVERY 4 HOURS AS NEEDED, Disp: 18 g, Rfl: 1   cetirizine (ZYRTEC) 10 MG tablet, TAKE 1 TABLET BY MOUTH DAILY, Disp: 90 tablet, Rfl: 0   desonide (DESOWEN) 0.05 % ointment, APPLY TOPICALLY TO THE AFFECTED AREA TWICE DAILY AS NEEDED FOR RASH, Disp: 15 g, Rfl: 2   EPINEPHrine 0.3 mg/0.3 mL IJ SOAJ  injection, Inject 0.3 mg (1 pen) into the muscle as directed., Disp: 2 each, Rfl: 0   predniSONE (DELTASONE) 20 MG tablet, Take 2 tablets (40 mg total) by mouth daily with breakfast for 5 days., Disp: 10 tablet, Rfl: 0   Spacer/Aero-Holding Chambers (AEROCHAMBER PLUS) inhaler, Use as instructed, Disp: 1 each, Rfl: 2   Spacer/Aero-Holding Chambers (AEROCHAMBER PLUS) inhaler, Use as instructed, Disp: 1 each, Rfl: 2   triamcinolone cream (KENALOG) 0.1 %, APPLY TOPICALLY TO THE AFFECTED AREA TWICE DAILY, Disp: 30 g, Rfl: 0  Observations/Objective: Patient is well-developed, well-nourished in no acute distress.  Resting comfortably  at home.  Head is normocephalic, atraumatic.  No labored breathing.  Speech is clear and coherent with logical content.  Patient is alert and oriented at baseline.    Assessment and Plan: 1. Encounter for emergency contraception (Primary) - levonorgestrel (PLAN B ONE-STEP) 1.5 MG tablet; Take 1 tablet (1.5 mg total) by mouth once for 1 dose.  Dispense: 1 tablet; Refill: 0  Take medicine as prescribed.  Pt is aware that in the future she'll have to f/u with PCP or GYN for any contraception Rxs.  Pt verbalized understanding and in agreement.    Follow Up Instructions: I discussed the assessment and treatment plan with the patient. The patient was provided an opportunity to ask questions and all were answered. The patient agreed with the plan and demonstrated an understanding of the instructions.  A copy of instructions were sent to the patient via MyChart unless otherwise noted below.   Patient has requested to receive PHI (AVS, Work Notes, etc) pertaining to this video visit through e-mail as they are currently without active MyChart. They have voiced understand that email is not considered secure and their health information could be viewed by someone other than the patient.   The patient was advised to call back or seek an in-person evaluation if the symptoms  worsen or if the condition fails to improve as anticipated.    Gilberto Better, PA-C

## 2024-01-03 NOTE — Patient Instructions (Signed)
  Jill Reynolds, thank you for joining Gilberto Better, PA-C for today's virtual visit.  While this provider is not your primary care provider (PCP), if your PCP is located in our provider database this encounter information will be shared with them immediately following your visit.   A [facility] MyChart account gives you access to today's visit and all your visits, tests, and labs performed at [facility] " click here if you don't have a [facility] MyChart account or go to [email or web address]  Consent: (Patient) Jill Reynolds provided verbal consent for this virtual visit at the beginning of the encounter.  Current Medications:  Current Outpatient Medications:    levonorgestrel (PLAN B ONE-STEP) 1.5 MG tablet, Take 1 tablet (1.5 mg total) by mouth once for 1 dose., Disp: 1 tablet, Rfl: 0   albuterol (VENTOLIN HFA) 108 (90 Base) MCG/ACT inhaler, INHALE 2 PUFFS INTO THE LUNGS EVERY 4 HOURS AS NEEDED, Disp: 18 g, Rfl: 1   cetirizine (ZYRTEC) 10 MG tablet, TAKE 1 TABLET BY MOUTH DAILY, Disp: 90 tablet, Rfl: 0   desonide (DESOWEN) 0.05 % ointment, APPLY TOPICALLY TO THE AFFECTED AREA TWICE DAILY AS NEEDED FOR RASH, Disp: 15 g, Rfl: 2   EPINEPHrine 0.3 mg/0.3 mL IJ SOAJ injection, Inject 0.3 mg (1 pen) into the muscle as directed., Disp: 2 each, Rfl: 0   predniSONE (DELTASONE) 20 MG tablet, Take 2 tablets (40 mg total) by mouth daily with breakfast for 5 days., Disp: 10 tablet, Rfl: 0   Spacer/Aero-Holding Chambers (AEROCHAMBER PLUS) inhaler, Use as instructed, Disp: 1 each, Rfl: 2   Spacer/Aero-Holding Chambers (AEROCHAMBER PLUS) inhaler, Use as instructed, Disp: 1 each, Rfl: 2   triamcinolone cream (KENALOG) 0.1 %, APPLY TOPICALLY TO THE AFFECTED AREA TWICE DAILY, Disp: 30 g, Rfl: 0   Medications ordered in this encounter:  Meds ordered this encounter  Medications   levonorgestrel (PLAN B ONE-STEP) 1.5 MG tablet    Sig: Take 1 tablet (1.5 mg total) by mouth once for 1 dose.     Dispense:  1 tablet    Refill:  0    Supervising Provider:   Merrilee Jansky [[id number]]     *If you need refills on other medications prior to your next appointment, please contact your pharmacy*  Follow-Up: Call back or seek an in-person evaluation if the symptoms worsen or if the condition fails to improve as anticipated.  [facility] [phone]  Other Instructions Take medicine as prescribed.  Pt is aware that in the future she'll have to f/u with PCP or GYN for any contraception Rxs.    If you have been instructed to have an in-person evaluation today at a local Urgent Care facility, please use the link below. It will take you to a list of all of our available [facility], including address, phone number and hours of operation. Please do not delay care.  [facility]  If you or a family member do not have a primary care provider, use the link below to schedule a visit and establish care. When you choose a [facility] primary care physician or advanced practice provider, you gain a long-term partner in health. Find a Primary Care Provider  Learn more about [facility] in-office and virtual care options: [facility] - Get Care Now

## 2024-01-03 NOTE — Progress Notes (Signed)
Because we do not prescribe contraception via e-visit or virtual urgent care visits, I feel your condition warrants further evaluation and I recommend that you be seen for a face to face visit.  Please contact your primary care physician practice to be seen. Many offices offer virtual options to be seen via video if you are not comfortable going in person to a medical facility at this time. You can also get this OTC if needed.   NOTE: You will NOT be charged for this eVisit.  If you do not have a PCP, [facility] offers a free physician referral service available at [phone]. Our trained staff has the experience, knowledge and resources to put you in touch with a physician who is right for you.    If you are having a true medical emergency please call 911.   Your e-visit answers were reviewed by a board certified advanced clinical practitioner to complete your personal care plan.  Thank you for using e-Visits.

## 2024-01-03 NOTE — Telephone Encounter (Signed)
Patient referred to infusion pharmacy team for ambulatory infusion of IV iron.  Insurance - Generic [employer]  Dx code - D50.9 IV Iron Therapy - Venofer 200 mg IV x 5 Infusion appointments - Scheduling team will schedule patient as soon as possible.   Demetrius Charity, PharmD

## 2024-01-04 ENCOUNTER — Telehealth: Payer: PRIVATE HEALTH INSURANCE

## 2024-01-04 ENCOUNTER — Ambulatory Visit: Payer: PRIVATE HEALTH INSURANCE | Admitting: Student

## 2024-01-08 ENCOUNTER — Ambulatory Visit (INDEPENDENT_AMBULATORY_CARE_PROVIDER_SITE_OTHER): Payer: PRIVATE HEALTH INSURANCE

## 2024-01-08 VITALS — BP 106/67 | HR 89 | Temp 98.9°F | Resp 18 | Ht 61.5 in | Wt 205.4 lb

## 2024-01-08 DIAGNOSIS — D509 Iron deficiency anemia, unspecified: Secondary | ICD-10-CM

## 2024-01-08 MED ORDER — ACETAMINOPHEN 325 MG PO TABS
650.0000 mg | ORAL_TABLET | Freq: Once | ORAL | Status: AC
  Administered 2024-01-08: 650 mg via ORAL
  Filled 2024-01-08: qty 2

## 2024-01-08 MED ORDER — SODIUM CHLORIDE 0.9 % IV BOLUS
250.0000 mL | Freq: Once | INTRAVENOUS | Status: AC
  Administered 2024-01-08: 250 mL via INTRAVENOUS
  Filled 2024-01-08: qty 250

## 2024-01-08 MED ORDER — DIPHENHYDRAMINE HCL 25 MG PO CAPS
25.0000 mg | ORAL_CAPSULE | Freq: Once | ORAL | Status: AC
Start: 2024-01-08 — End: 2024-01-08
  Administered 2024-01-08: 25 mg via ORAL
  Filled 2024-01-08: qty 1

## 2024-01-08 MED ORDER — IRON SUCROSE 20 MG/ML IV SOLN
200.0000 mg | Freq: Once | INTRAVENOUS | Status: AC
  Administered 2024-01-08: 200 mg via INTRAVENOUS
  Filled 2024-01-08: qty 10

## 2024-01-08 NOTE — Progress Notes (Signed)
 Diagnosis: Acute Anemia  Provider:  Chilton Greathouse MD  Procedure: IV Push  IV Type: Peripheral, IV Location: R Forearm  Venofer (Iron Sucrose), Dose: 200 mg  Post Infusion IV Care: Observation period completed and Peripheral IV Discontinued  Discharge: Condition: Good, Destination: Home . AVS Provided  Performed by:  Nat Math, RN

## 2024-01-10 ENCOUNTER — Ambulatory Visit: Payer: PRIVATE HEALTH INSURANCE

## 2024-01-11 ENCOUNTER — Ambulatory Visit (INDEPENDENT_AMBULATORY_CARE_PROVIDER_SITE_OTHER): Payer: PRIVATE HEALTH INSURANCE

## 2024-01-11 VITALS — BP 112/70 | HR 86 | Temp 98.1°F | Resp 18

## 2024-01-11 DIAGNOSIS — D509 Iron deficiency anemia, unspecified: Secondary | ICD-10-CM

## 2024-01-11 MED ORDER — IRON SUCROSE 20 MG/ML IV SOLN
200.0000 mg | Freq: Once | INTRAVENOUS | Status: AC
  Administered 2024-01-11: 200 mg via INTRAVENOUS
  Filled 2024-01-11: qty 10

## 2024-01-11 MED ORDER — DIPHENHYDRAMINE HCL 25 MG PO CAPS
25.0000 mg | ORAL_CAPSULE | Freq: Once | ORAL | Status: AC
Start: 2024-01-11 — End: 2024-01-11
  Administered 2024-01-11: 25 mg via ORAL
  Filled 2024-01-11: qty 1

## 2024-01-11 MED ORDER — SODIUM CHLORIDE 0.9 % IV BOLUS
250.0000 mL | Freq: Once | INTRAVENOUS | Status: AC
  Administered 2024-01-11: 250 mL via INTRAVENOUS
  Filled 2024-01-11: qty 250

## 2024-01-11 MED ORDER — ACETAMINOPHEN 325 MG PO TABS
650.0000 mg | ORAL_TABLET | Freq: Once | ORAL | Status: AC
  Administered 2024-01-11: 650 mg via ORAL
  Filled 2024-01-11: qty 2

## 2024-01-11 NOTE — Progress Notes (Signed)
 Diagnosis: Iron Deficiency Anemia  Provider:  Chilton Greathouse MD  Procedure: IV Push  IV Type: Peripheral, IV Location: L Forearm  Patient c/o feeling of pressure with IVP. IVP paused and NS started at 400 mL/hour; symptoms resolved. IVP completed with NS running; patient denied any symptoms.  Venofer (Iron Sucrose), Dose: 200 mg  Post Infusion IV Care: Observation period completed and Peripheral IV Discontinued  Discharge: Condition: Good, Destination: Home . AVS Provided  Performed by:  Loney Hering, LPN

## 2024-01-14 ENCOUNTER — Ambulatory Visit (INDEPENDENT_AMBULATORY_CARE_PROVIDER_SITE_OTHER): Payer: PRIVATE HEALTH INSURANCE

## 2024-01-14 VITALS — BP 112/67 | HR 83 | Temp 98.8°F | Resp 16 | Ht 61.0 in | Wt 200.2 lb

## 2024-01-14 DIAGNOSIS — D509 Iron deficiency anemia, unspecified: Secondary | ICD-10-CM

## 2024-01-14 MED ORDER — DIPHENHYDRAMINE HCL 25 MG PO CAPS
25.0000 mg | ORAL_CAPSULE | Freq: Once | ORAL | Status: AC
  Administered 2024-01-14: 25 mg via ORAL
  Filled 2024-01-14: qty 1

## 2024-01-14 MED ORDER — IRON SUCROSE 20 MG/ML IV SOLN
200.0000 mg | Freq: Once | INTRAVENOUS | Status: AC
Start: 2024-01-14 — End: 2024-01-14
  Administered 2024-01-14: 200 mg via INTRAVENOUS
  Filled 2024-01-14: qty 10

## 2024-01-14 MED ORDER — SODIUM CHLORIDE 0.9 % IV BOLUS
250.0000 mL | Freq: Once | INTRAVENOUS | Status: DC
Start: 2024-01-14 — End: 2024-01-14
  Filled 2024-01-14: qty 250

## 2024-01-14 MED ORDER — SODIUM CHLORIDE 0.9 % IV BOLUS
250.0000 mL | Freq: Once | INTRAVENOUS | Status: AC
  Administered 2024-01-14: 250 mL via INTRAVENOUS
  Filled 2024-01-14: qty 250

## 2024-01-14 MED ORDER — ACETAMINOPHEN 325 MG PO TABS
650.0000 mg | ORAL_TABLET | Freq: Once | ORAL | Status: AC
  Administered 2024-01-14: 650 mg via ORAL
  Filled 2024-01-14: qty 2

## 2024-01-14 NOTE — Progress Notes (Signed)
 Diagnosis: Iron Deficiency Anemia  Provider:  Chilton Greathouse MD  Procedure: IV Push  IV Type: Peripheral, IV Location: L Forearm  Venofer (Iron Sucrose), Dose: 200 mg   Normal Saline, Dose: 250 mg  Infusion Start Time: 1605  Infusion Stop Time: 1618  Post Infusion IV Care: Observation period completed and Peripheral IV Discontinued  Discharge: Condition: Good, Destination: Home . AVS Declined  Performed by:  Loney Hering, LPN

## 2024-01-16 ENCOUNTER — Ambulatory Visit (INDEPENDENT_AMBULATORY_CARE_PROVIDER_SITE_OTHER): Payer: PRIVATE HEALTH INSURANCE

## 2024-01-16 VITALS — BP 119/69 | HR 80 | Temp 98.4°F | Resp 18 | Ht 61.0 in | Wt 204.8 lb

## 2024-01-16 DIAGNOSIS — D509 Iron deficiency anemia, unspecified: Secondary | ICD-10-CM | POA: Diagnosis not present

## 2024-01-16 MED ORDER — SODIUM CHLORIDE 0.9 % IV BOLUS
250.0000 mL | Freq: Once | INTRAVENOUS | Status: AC
  Administered 2024-01-16: 250 mL via INTRAVENOUS
  Filled 2024-01-16: qty 250

## 2024-01-16 MED ORDER — IRON SUCROSE 20 MG/ML IV SOLN
200.0000 mg | Freq: Once | INTRAVENOUS | Status: AC
  Administered 2024-01-16: 200 mg via INTRAVENOUS
  Filled 2024-01-16: qty 10

## 2024-01-16 MED ORDER — ACETAMINOPHEN 325 MG PO TABS
650.0000 mg | ORAL_TABLET | Freq: Once | ORAL | Status: DC
Start: 2024-01-16 — End: 2024-01-16

## 2024-01-16 MED ORDER — DIPHENHYDRAMINE HCL 25 MG PO CAPS: 25.0000 mg | ORAL_CAPSULE | Freq: Once | ORAL | Status: DC

## 2024-01-16 NOTE — Progress Notes (Signed)
 Diagnosis: Iron Deficiency Anemia  Provider:  Chilton Greathouse MD  Procedure: IV Push  IV Type: Peripheral, IV Location: R Forearm  Venofer (Iron Sucrose), Dose: 200 mg  Post Infusion IV Care: Observation period completed and Peripheral IV Discontinued  Discharge: Condition: Good, Destination: Home . AVS Provided  Performed by:  Rico Ala, LPN

## 2024-01-18 ENCOUNTER — Encounter: Payer: Self-pay | Admitting: Family Medicine

## 2024-01-18 ENCOUNTER — Ambulatory Visit (INDEPENDENT_AMBULATORY_CARE_PROVIDER_SITE_OTHER): Payer: PRIVATE HEALTH INSURANCE

## 2024-01-18 VITALS — BP 138/81 | HR 83 | Temp 97.9°F | Resp 16 | Ht 61.0 in | Wt 200.2 lb

## 2024-01-18 DIAGNOSIS — D509 Iron deficiency anemia, unspecified: Secondary | ICD-10-CM

## 2024-01-18 MED ORDER — ACETAMINOPHEN 325 MG PO TABS
650.0000 mg | ORAL_TABLET | Freq: Once | ORAL | Status: DC
  Filled 2024-01-18: qty 2

## 2024-01-18 MED ORDER — IRON SUCROSE 20 MG/ML IV SOLN
200.0000 mg | Freq: Once | INTRAVENOUS | Status: AC
  Administered 2024-01-18: 200 mg via INTRAVENOUS
  Filled 2024-01-18: qty 10

## 2024-01-18 MED ORDER — SODIUM CHLORIDE 0.9 % IV BOLUS
250.0000 mL | Freq: Once | INTRAVENOUS | Status: AC
  Administered 2024-01-18: 250 mL via INTRAVENOUS
  Filled 2024-01-18: qty 250

## 2024-01-18 MED ORDER — DIPHENHYDRAMINE HCL 25 MG PO CAPS
25.0000 mg | ORAL_CAPSULE | Freq: Once | ORAL | Status: DC
  Filled 2024-01-18: qty 1

## 2024-01-18 NOTE — Progress Notes (Signed)
 Diagnosis: Iron Deficiency Anemia  Provider:  Chilton Greathouse MD  Procedure: IV Push  IV Type: Peripheral, IV Location: L Forearm  Venofer (Iron Sucrose), Dose: 200 mg  Post Infusion IV Care: Observation period completed and Peripheral IV Discontinued  Discharge: Condition: Good, Destination: Home . AVS Declined  Performed by:  Marilynn Rail, RN

## 2024-04-01 ENCOUNTER — Telehealth: Payer: Self-pay | Admitting: Family Medicine

## 2024-04-01 ENCOUNTER — Encounter: Payer: Self-pay | Admitting: Family Medicine

## 2024-04-01 ENCOUNTER — Encounter: Payer: Self-pay | Admitting: Student

## 2024-04-01 ENCOUNTER — Ambulatory Visit: Payer: PRIVATE HEALTH INSURANCE

## 2024-04-01 NOTE — Telephone Encounter (Signed)
 Patient dropped of physical form to be completed. Last DOS was 01/01/24. Placed in [facility].

## 2024-04-01 NOTE — Telephone Encounter (Signed)
Apt scheduled for tomorrow.

## 2024-04-01 NOTE — Telephone Encounter (Signed)
 Placed in dr dahbura's box to fiil out fot appt tomorrow. Ashaun Gaughan Maynard Spears, CMA

## 2024-04-02 ENCOUNTER — Ambulatory Visit (INDEPENDENT_AMBULATORY_CARE_PROVIDER_SITE_OTHER): Payer: PRIVATE HEALTH INSURANCE | Admitting: Student

## 2024-04-02 VITALS — BP 128/71 | HR 73 | Ht 61.0 in | Wt 211.6 lb

## 2024-04-02 DIAGNOSIS — L2089 Other atopic dermatitis: Secondary | ICD-10-CM | POA: Diagnosis not present

## 2024-04-02 DIAGNOSIS — D509 Iron deficiency anemia, unspecified: Secondary | ICD-10-CM

## 2024-04-02 MED ORDER — TRIAMCINOLONE ACETONIDE 0.1 % EX CREA: TOPICAL_CREAM | Freq: Two times a day (BID) | CUTANEOUS | 1 refills | Status: AC

## 2024-04-02 NOTE — Telephone Encounter (Signed)
 Completed during clinic encounter.

## 2024-04-02 NOTE — Assessment & Plan Note (Signed)
 Reviewed recent labs, received IV iron  ~2 months ago. Obtain CBC, iron  panel.

## 2024-04-02 NOTE — Assessment & Plan Note (Signed)
Refill triamcinolone as requested.

## 2024-04-02 NOTE — Progress Notes (Signed)
  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  She had annual physical on 08/08/2023.  Not on any birth control. She is currently taking oral iron  supplements that were over the counter.   PERTINENT  PMH / PSH: Sickle cell trait, eczema, seasonal allergies, asthma  OBJECTIVE:  BP 128/71   Pulse 73   Ht 5\' 1"  (1.549 m)   Wt 211 lb 9.6 oz (96 kg)   SpO2 97%   BMI 39.98 kg/m  Hearing Screening   500Hz  1000Hz  2000Hz  4000Hz   Right ear Pass Pass Pass Pass  Left ear Pass Pass Pass Pass   Vision Screening   Right eye Left eye Both eyes  Without correction 20/25 20/30 20/30   With correction      Gen: well-appearing, NAD CV: RRR, no murmurs appreciable Pulm: normal WOB  ASSESSMENT/PLAN:   Assessment & Plan Iron  deficiency anemia, unspecified iron  deficiency anemia type Reviewed recent labs, received IV iron  ~2 months ago. Obtain CBC, iron  panel. Other atopic dermatitis Refill triamcinolone  as requested.   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  Veronia Goon, DO 04/02/2024, 4:00 PM PGY-3, [facility]

## 2024-04-03 ENCOUNTER — Ambulatory Visit: Payer: Self-pay | Admitting: Student

## 2024-04-03 LAB — CBC
Hematocrit: 39.1 % (ref 34.0–46.6)
Hemoglobin: 11.8 g/dL (ref 11.1–15.9)
MCH: 22.3 pg — ABNORMAL LOW (ref 26.6–33.0)
MCHC: 30.2 g/dL — ABNORMAL LOW (ref 31.5–35.7)
MCV: 74 fL — ABNORMAL LOW (ref 79–97)
Platelets: 307 10*3/uL (ref 150–450)
RBC: 5.28 x10E6/uL (ref 3.77–5.28)
RDW: 20.1 % — ABNORMAL HIGH (ref 11.7–15.4)
WBC: 6.4 10*3/uL (ref 3.4–10.8)

## 2024-04-03 LAB — IRON,TIBC AND FERRITIN PANEL
Ferritin: 13 ng/mL — ABNORMAL LOW (ref 15–150)
Iron Saturation: 7 % — CL (ref 15–55)
Iron: 25 ug/dL — ABNORMAL LOW (ref 27–159)
Total Iron Binding Capacity: 369 ug/dL (ref 250–450)
UIBC: 344 ug/dL (ref 131–425)

## 2024-04-19 ENCOUNTER — Other Ambulatory Visit: Payer: Self-pay | Admitting: Family Medicine

## 2024-04-19 DIAGNOSIS — Z9109 Other allergy status, other than to drugs and biological substances: Secondary | ICD-10-CM

## 2024-05-12 ENCOUNTER — Telehealth: Payer: Self-pay | Admitting: Physician Assistant

## 2024-05-12 DIAGNOSIS — Z0000 Encounter for general adult medical examination without abnormal findings: Secondary | ICD-10-CM

## 2024-05-12 DIAGNOSIS — Z0279 Encounter for issue of other medical certificate: Secondary | ICD-10-CM

## 2024-05-12 NOTE — Patient Instructions (Signed)
  Jessalynn Fischl, thank you for joining Harlene PEDLAR Bardin, PA-C for today's virtual visit.  While this provider is not your primary care provider (PCP), if your PCP is located in our provider database this encounter information will be shared with them immediately following your visit.   A [facility] MyChart account gives you access to today's visit and all your visits, tests, and labs performed at [facility]  click here if you don't have a [facility] MyChart account or go to [email or web address]  Consent: (Patient) Jill Reynolds provided verbal consent for this virtual visit at the beginning of the encounter.  Current Medications:  Current Outpatient Medications:    albuterol  (VENTOLIN  HFA) 108 (90 Base) MCG/ACT inhaler, INHALE 2 PUFFS INTO THE LUNGS EVERY 4 HOURS AS NEEDED, Disp: 18 g, Rfl: 1   cetirizine  (ZYRTEC ) 10 MG tablet, TAKE 1 TABLET BY MOUTH DAILY, Disp: 90 tablet, Rfl: 1   desonide  (DESOWEN ) 0.05 % ointment, APPLY TOPICALLY TO THE AFFECTED AREA TWICE DAILY AS NEEDED FOR RASH, Disp: 15 g, Rfl: 2   EPINEPHrine  0.3 mg/0.3 mL IJ SOAJ injection, Inject 0.3 mg (1 pen) into the muscle as directed., Disp: 2 each, Rfl: 0   Spacer/Aero-Holding Chambers (AEROCHAMBER PLUS) inhaler, Use as instructed, Disp: 1 each, Rfl: 2   Spacer/Aero-Holding Chambers (AEROCHAMBER PLUS) inhaler, Use as instructed, Disp: 1 each, Rfl: 2   triamcinolone  cream (KENALOG ) 0.1 %, Apply topically 2 (two) times daily. Avoid face, hands, feet. Do not use for more than 5 days at a time., Disp: 80 g, Rfl: 1   Medications ordered in this encounter:  No orders of the defined types were placed in this encounter.    *If you need refills on other medications prior to your next appointment, please contact your pharmacy*  Follow-Up: Call back or seek an in-person evaluation if the symptoms worsen or if the condition fails to improve as anticipated.  [facility] [phone]  Other  Instructions Recommend following up with employer HR and your Primary Care Physician if needed.    If you have been instructed to have an in-person evaluation today at a local Urgent Care facility, please use the link below. It will take you to a list of all of our available [facility], including address, phone number and hours of operation. Please do not delay care.  [facility]  If you or a family member do not have a primary care provider, use the link below to schedule a visit and establish care. When you choose a [facility] primary care physician or advanced practice provider, you gain a long-term partner in health. Find a Primary Care Provider  Learn more about [facility]'s in-office and virtual care options:  - Get Care Now

## 2024-05-12 NOTE — Progress Notes (Signed)
 Virtual Visit Consent   Jill Reynolds, you are scheduled for a virtual visit with a [facility] provider today. Just as with appointments in the office, your consent must be obtained to participate. Your consent will be active for this visit and any virtual visit you may have with one of our providers in the next 365 days. If you have a MyChart account, a copy of this consent can be sent to you electronically.  As this is a virtual visit, video technology does not allow for your provider to perform a traditional examination. This may limit your provider's ability to fully assess your condition. If your provider identifies any concerns that need to be evaluated in person or the need to arrange testing (such as labs, EKG, etc.), we will make arrangements to do so. Although advances in technology are sophisticated, we cannot ensure that it will always work on either your end or our end. If the connection with a video visit is poor, the visit may have to be switched to a telephone visit. With either a video or telephone visit, we are not always able to ensure that we have a secure connection.  By engaging in this virtual visit, you consent to the provision of healthcare and authorize for your insurance to be billed (if applicable) for the services provided during this visit. Depending on your insurance coverage, you may receive a charge related to this service.  I need to obtain your verbal consent now. Are you willing to proceed with your visit today? Jill Reynolds has provided verbal consent on 05/12/2024 for a virtual visit (video or telephone). Jill PEDLAR Corsi, PA-C  Date: 05/12/2024 6:18 PM   Virtual Visit via Video Note   I, Jill PEDLAR Skluzacek, connected with  Jill Reynolds  ([id number], August 11, 1996) on 05/12/24 at  6:15 PM EDT by a video-enabled telemedicine application and verified that I am speaking with the correct person using two identifiers.  Location: Patient: Virtual Visit Location Patient:  Home Provider: Virtual Visit Location Provider: Home Office   I discussed the limitations of evaluation and management by telemedicine and the availability of in person appointments. The patient expressed understanding and agreed to proceed.    History of Present Illness: Jill Reynolds is a 28 y.o. who identifies as a female who was assigned female at birth, and is being seen today for a note to return to work.  She reports she has been on a personal leave of absence, but her employer requested a note from medical provider for her to return.    HPI: HPI  Problems:  Patient Active Problem List   Diagnosis Date Noted   Physical exam, annual 06/12/2022   Seasonal and perennial allergic rhinoconjunctivitis 08/10/2021   Adverse effect of other drugs, medicaments and biological substances, subsequent encounter 05/24/2021   Hypomenorrhea 02/24/2021   History of allergy 02/24/2021   Hyperglycemia 02/06/2020   Iron  deficiency anemia 10/24/2016   Pica 10/23/2016   Other atopic dermatitis 09/04/2014   Sickle cell trait (HCC) 02/07/2012   Moderate persistent asthma without complication 01/10/2007    Allergies:  Allergies  Allergen Reactions   Vicodin [Hydrocodone-Acetaminophen ] Anaphylaxis   Medications:  Current Outpatient Medications:    albuterol  (VENTOLIN  HFA) 108 (90 Base) MCG/ACT inhaler, INHALE 2 PUFFS INTO THE LUNGS EVERY 4 HOURS AS NEEDED, Disp: 18 g, Rfl: 1   cetirizine  (ZYRTEC ) 10 MG tablet, TAKE 1 TABLET BY MOUTH DAILY, Disp: 90 tablet, Rfl: 1   desonide  (DESOWEN ) 0.05 % ointment, APPLY  TOPICALLY TO THE AFFECTED AREA TWICE DAILY AS NEEDED FOR RASH, Disp: 15 g, Rfl: 2   EPINEPHrine  0.3 mg/0.3 mL IJ SOAJ injection, Inject 0.3 mg (1 pen) into the muscle as directed., Disp: 2 each, Rfl: 0   Spacer/Aero-Holding Chambers (AEROCHAMBER PLUS) inhaler, Use as instructed, Disp: 1 each, Rfl: 2   Spacer/Aero-Holding Chambers (AEROCHAMBER PLUS) inhaler, Use as instructed, Disp: 1 each, Rfl: 2    triamcinolone  cream (KENALOG ) 0.1 %, Apply topically 2 (two) times daily. Avoid face, hands, feet. Do not use for more than 5 days at a time., Disp: 80 g, Rfl: 1  Observations/Objective: Patient is well-developed, well-nourished in no acute distress.  Resting comfortably at home.  Head is normocephalic, atraumatic.  No labored breathing.  Speech is clear and coherent with logical content.  Patient is alert and oriented at baseline.    Assessment and Plan: 1. Well adult exam (Primary)  Work note provided.  Advised pt to follow up with HR and PCP.   Follow Up Instructions: I discussed the assessment and treatment plan with the patient. The patient was provided an opportunity to ask questions and all were answered. The patient agreed with the plan and demonstrated an understanding of the instructions.  A copy of instructions were sent to the patient via MyChart unless otherwise noted below.     The patient was advised to call back or seek an in-person evaluation if the symptoms worsen or if the condition fails to improve as anticipated.    Jill PEDLAR Ribaudo, PA-C

## 2024-05-19 ENCOUNTER — Ambulatory Visit (INDEPENDENT_AMBULATORY_CARE_PROVIDER_SITE_OTHER): Payer: PRIVATE HEALTH INSURANCE

## 2024-05-19 ENCOUNTER — Encounter: Payer: Self-pay | Admitting: Family Medicine

## 2024-05-19 VITALS — BP 131/69 | HR 90 | Ht 61.0 in | Wt 213.5 lb

## 2024-05-19 DIAGNOSIS — D509 Iron deficiency anemia, unspecified: Secondary | ICD-10-CM

## 2024-05-19 DIAGNOSIS — Z889 Allergy status to unspecified drugs, medicaments and biological substances status: Secondary | ICD-10-CM

## 2024-05-19 DIAGNOSIS — R220 Localized swelling, mass and lump, head: Secondary | ICD-10-CM

## 2024-05-19 MED ORDER — EPINEPHRINE 0.3 MG/0.3ML IJ SOAJ
0.3000 mg | INTRAMUSCULAR | 0 refills | Status: AC
Start: 2024-05-19 — End: ?

## 2024-05-19 NOTE — Progress Notes (Signed)
    SUBJECTIVE:   CHIEF COMPLAINT / HPI:   Iron  deficiency anemia: Reports energy level is substantially improved since Onset of symptoms February 2025.  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  No headache, dizziness, lightheadedness, chest pain, SOB, or syncope.  Recently switched to iron  supplement with vitamin B12 and folate.  FMLA paperwork: Patient reports that third had an worsening headache over 2 days, went to the dentist for jaw realignment.  Following the visit she had marked swelling of her right mandible without erythema or tenderness.  No other symptoms, including, fever, chills, sinusitis, pharyngitis, or difficulty swallowing.  She was told by dentist ligament, swelling would take several weeks to finish.  Reports that it was painful and difficult to speak, so she could not go to work where she primarily speaks on the phone.  PERTINENT  PMH / PSH: n/a  OBJECTIVE:   BP 131/69   Pulse 90   Ht 5' 1 (1.549 m)   Wt 213 lb 8 oz (96.8 kg)   LMP 05/11/2024   SpO2 100%   BMI 40.34 kg/m   Physical Exam Constitutional:      Appearance: She is obese.  HENT:     Mouth/Throat:     Mouth: Mucous membranes are moist.     Pharynx: No oropharyngeal exudate or posterior oropharyngeal erythema.     Comments: Oral exam found minimal edema of right labial gum, small pyogenic granuloma at base of molar #2.  Nontender, not bleeding Cardiovascular:     Rate and Rhythm: Normal rate and regular rhythm.     Pulses: Normal pulses.     Heart sounds: Normal heart sounds. No murmur heard.    No gallop.  Pulmonary:     Effort: Pulmonary effort is normal.     Breath sounds: Normal breath sounds.  Musculoskeletal:     Cervical back: Normal range of motion and neck supple. No rigidity or tenderness.  Lymphadenopathy:     Cervical: No cervical adenopathy.  Neurological:     Mental  Status: She is alert.      ASSESSMENT/PLAN:   Assessment & Plan History of allergy Represcribed EpiPen  Iron  deficiency anemia, unspecified iron  deficiency anemia type Hemoglobin on 01/01/2024 7.1 with iron  saturation of 3.  Post iron  transfusion hemoglobin 11.8 with ferritin of 13 on 04/02/2024.  Repeat CBC and iron  studies today. Right facial swelling With family treatment, physical exam unremarkable except for small small granuloma at base of second molar right side.     Fairy Amy, MD [facility]

## 2024-05-19 NOTE — Assessment & Plan Note (Signed)
 Hemoglobin on 01/01/2024 7.1 with iron  saturation of 3.  Post iron  transfusion hemoglobin 11.8 with ferritin of 13 on 04/02/2024.  Repeat CBC and iron  studies today.

## 2024-05-19 NOTE — Assessment & Plan Note (Signed)
 Represcribed EpiPen 

## 2024-05-19 NOTE — Patient Instructions (Addendum)
 Good to see you today - Thank you for coming in  Things we discussed today:  Iron  deficiency anemia: repeat labs to monitor the repletion of your iron  stores. It appears you are most likely low on energy from over-work/poor sleep hygiene. I have attached a handout regarding healthy sleep practices.  FMLA paperwork: I filled out your paperwork for your work leave of absence.  Please always bring your medication bottles  Come back to see your PCP in 3-4 months to follow-up on iron  studies.  Dr. Fairy Amy, MD [facility] Resident, PGY-1

## 2024-05-20 ENCOUNTER — Ambulatory Visit: Payer: Self-pay

## 2024-05-20 LAB — CBC WITH DIFFERENTIAL/PLATELET
Basophils Absolute: 0 x10E3/uL (ref 0.0–0.2)
Basos: 0 %
EOS (ABSOLUTE): 0.5 x10E3/uL — ABNORMAL HIGH (ref 0.0–0.4)
Eos: 6 %
Hematocrit: 44 % (ref 34.0–46.6)
Hemoglobin: 12.9 g/dL (ref 11.1–15.9)
Immature Grans (Abs): 0 x10E3/uL (ref 0.0–0.1)
Immature Granulocytes: 0 %
Lymphocytes Absolute: 2 x10E3/uL (ref 0.7–3.1)
Lymphs: 26 %
MCH: 22.8 pg — ABNORMAL LOW (ref 26.6–33.0)
MCHC: 29.3 g/dL — ABNORMAL LOW (ref 31.5–35.7)
MCV: 78 fL — ABNORMAL LOW (ref 79–97)
Monocytes Absolute: 0.5 x10E3/uL (ref 0.1–0.9)
Monocytes: 6 %
Neutrophils Absolute: 4.6 x10E3/uL (ref 1.4–7.0)
Neutrophils: 62 %
Platelets: 403 x10E3/uL (ref 150–450)
RBC: 5.67 x10E6/uL — ABNORMAL HIGH (ref 3.77–5.28)
RDW: 16.2 % — ABNORMAL HIGH (ref 11.7–15.4)
WBC: 7.6 x10E3/uL (ref 3.4–10.8)

## 2024-05-20 LAB — IRON,TIBC AND FERRITIN PANEL
Ferritin: 23 ng/mL (ref 15–150)
Iron Saturation: 7 % — CL (ref 15–55)
Iron: 26 ug/dL — ABNORMAL LOW (ref 27–159)
Total Iron Binding Capacity: 374 ug/dL (ref 250–450)
UIBC: 348 ug/dL (ref 131–425)

## 2024-05-30 ENCOUNTER — Encounter: Payer: Self-pay | Admitting: Advanced Practice Midwife
